# Patient Record
Sex: Female | Born: 1980 | Race: Black or African American | Hispanic: No | Marital: Single | Smoking: Former smoker
Health system: Southern US, Community
[De-identification: ages and names within clinical notes are randomized; demographics above are authoritative.]

## PROBLEM LIST (undated history)

## (undated) DIAGNOSIS — D649 Anemia, unspecified: Secondary | ICD-10-CM

## (undated) DIAGNOSIS — N946 Dysmenorrhea, unspecified: Secondary | ICD-10-CM

## (undated) HISTORY — DX: Anemia, unspecified: D64.9

## (undated) HISTORY — DX: Dysmenorrhea, unspecified: N94.6

---

## 2007-04-12 ENCOUNTER — Emergency Department (HOSPITAL_COMMUNITY): Admission: EM | Admit: 2007-04-12 | Discharge: 2007-04-13 | Payer: Self-pay | Admitting: Emergency Medicine

## 2007-06-12 ENCOUNTER — Encounter: Admission: RE | Admit: 2007-06-12 | Discharge: 2007-06-12 | Payer: Self-pay | Admitting: Family Medicine

## 2007-10-02 ENCOUNTER — Ambulatory Visit: Payer: Self-pay | Admitting: *Deleted

## 2007-10-02 ENCOUNTER — Ambulatory Visit: Payer: Self-pay | Admitting: Internal Medicine

## 2007-10-02 ENCOUNTER — Encounter: Payer: Self-pay | Admitting: Family Medicine

## 2007-10-02 LAB — CONVERTED CEMR LAB
AST: 16 units/L (ref 0–37)
Albumin: 4 g/dL (ref 3.5–5.2)
Alkaline Phosphatase: 87 units/L (ref 39–117)
BUN: 10 mg/dL (ref 6–23)
Calcium: 8.8 mg/dL (ref 8.4–10.5)
Chloride: 108 meq/L (ref 96–112)
Eosinophils Absolute: 0.2 10*3/uL (ref 0.0–0.7)
Glucose, Bld: 88 mg/dL (ref 70–99)
HDL: 56 mg/dL (ref >39)
LDL Cholesterol: 48 mg/dL (ref 0–99)
Lymphs Abs: 1.7 10*3/uL (ref 0.7–4.0)
Monocytes Relative: 8 % (ref 3–12)
Neutro Abs: 3.3 10*3/uL (ref 1.7–7.7)
Neutrophils Relative %: 59 % (ref 43–77)
Platelets: 343 10*3/uL (ref 150–400)
Potassium: 4.3 meq/L (ref 3.5–5.3)
RBC: 3.95 M/uL (ref 3.87–5.11)
Sodium: 141 meq/L (ref 135–145)
TSH: 1.704 microintl units/mL (ref 0.350–5.50)
Total Protein: 7.2 g/dL (ref 6.0–8.3)
Triglycerides: 130 mg/dL (ref <150)
WBC: 5.6 10*3/uL (ref 4.0–10.5)

## 2007-11-13 ENCOUNTER — Encounter: Payer: Self-pay | Admitting: Family Medicine

## 2007-11-13 ENCOUNTER — Ambulatory Visit: Payer: Self-pay | Admitting: Internal Medicine

## 2007-11-13 LAB — CONVERTED CEMR LAB
Lymphocytes Relative: 38 % (ref 12–46)
Lymphs Abs: 1.4 10*3/uL (ref 0.7–4.0)
MCV: 79.2 fL (ref 78.0–100.0)
Monocytes Relative: 12 % (ref 3–12)
Neutrophils Relative %: 46 % (ref 43–77)
Platelets: 165 10*3/uL (ref 150–400)
RBC: 4.09 M/uL (ref 3.87–5.11)
WBC: 3.6 10*3/uL — ABNORMAL LOW (ref 4.0–10.5)

## 2008-02-26 ENCOUNTER — Ambulatory Visit: Payer: Self-pay | Admitting: Internal Medicine

## 2008-07-14 ENCOUNTER — Emergency Department (HOSPITAL_COMMUNITY): Admission: EM | Admit: 2008-07-14 | Discharge: 2008-07-14 | Payer: Self-pay | Admitting: Emergency Medicine

## 2008-10-19 ENCOUNTER — Emergency Department (HOSPITAL_COMMUNITY): Admission: EM | Admit: 2008-10-19 | Discharge: 2008-10-19 | Payer: Self-pay | Admitting: Emergency Medicine

## 2009-06-13 ENCOUNTER — Emergency Department (HOSPITAL_COMMUNITY): Admission: EM | Admit: 2009-06-13 | Discharge: 2009-06-13 | Payer: Self-pay | Admitting: Emergency Medicine

## 2009-06-21 ENCOUNTER — Emergency Department (HOSPITAL_COMMUNITY): Admission: EM | Admit: 2009-06-21 | Discharge: 2009-06-21 | Payer: Self-pay | Admitting: Emergency Medicine

## 2009-10-21 ENCOUNTER — Emergency Department (HOSPITAL_COMMUNITY): Admission: EM | Admit: 2009-10-21 | Discharge: 2009-10-22 | Payer: Self-pay | Admitting: Emergency Medicine

## 2010-05-17 ENCOUNTER — Ambulatory Visit: Admit: 2010-05-17 | Payer: Self-pay | Admitting: Nurse Practitioner

## 2010-07-01 LAB — URINE MICROSCOPIC-ADD ON

## 2010-07-01 LAB — URINALYSIS, ROUTINE W REFLEX MICROSCOPIC
Glucose, UA: NEGATIVE mg/dL
Protein, ur: >300 mg/dL — AB
Specific Gravity, Urine: 1.025 (ref 1.005–1.030)
pH: 5.5 (ref 5.0–8.0)

## 2010-07-09 LAB — URINALYSIS, ROUTINE W REFLEX MICROSCOPIC
Ketones, ur: 40 mg/dL — AB
Specific Gravity, Urine: 1.027 (ref 1.005–1.030)
Urobilinogen, UA: 1 mg/dL (ref 0.0–1.0)

## 2010-07-09 LAB — URINE MICROSCOPIC-ADD ON

## 2010-07-09 LAB — POCT PREGNANCY, URINE: Preg Test, Ur: NEGATIVE

## 2010-07-22 LAB — URINALYSIS, ROUTINE W REFLEX MICROSCOPIC
Glucose, UA: NEGATIVE mg/dL
Protein, ur: NEGATIVE mg/dL
Specific Gravity, Urine: 1.024 (ref 1.005–1.030)
Urobilinogen, UA: 1 mg/dL (ref 0.0–1.0)

## 2010-07-22 LAB — URINE CULTURE: Colony Count: >=100000

## 2010-07-22 LAB — URINE MICROSCOPIC-ADD ON

## 2010-09-28 ENCOUNTER — Emergency Department (HOSPITAL_COMMUNITY)
Admission: EM | Admit: 2010-09-28 | Discharge: 2010-09-28 | Disposition: A | Discharge status: Home / Self Care | Payer: Medicaid Other | Attending: Emergency Medicine | Admitting: Emergency Medicine

## 2010-09-28 ENCOUNTER — Emergency Department (HOSPITAL_COMMUNITY): Payer: Medicaid Other

## 2010-09-28 DIAGNOSIS — S92919A Unspecified fracture of unspecified toe(s), initial encounter for closed fracture: Secondary | ICD-10-CM | POA: Insufficient documentation

## 2010-09-28 DIAGNOSIS — F17200 Nicotine dependence, unspecified, uncomplicated: Secondary | ICD-10-CM | POA: Insufficient documentation

## 2010-09-28 DIAGNOSIS — Y92009 Unspecified place in unspecified non-institutional (private) residence as the place of occurrence of the external cause: Secondary | ICD-10-CM | POA: Insufficient documentation

## 2010-09-28 DIAGNOSIS — M79609 Pain in unspecified limb: Secondary | ICD-10-CM | POA: Insufficient documentation

## 2010-09-28 DIAGNOSIS — IMO0002 Reserved for concepts with insufficient information to code with codable children: Secondary | ICD-10-CM | POA: Insufficient documentation

## 2010-12-11 ENCOUNTER — Emergency Department (HOSPITAL_COMMUNITY)
Admission: EM | Admit: 2010-12-11 | Discharge: 2010-12-12 | Disposition: A | Discharge status: Home / Self Care | Payer: Medicaid Other | Attending: Emergency Medicine | Admitting: Emergency Medicine

## 2010-12-11 DIAGNOSIS — R059 Cough, unspecified: Secondary | ICD-10-CM | POA: Insufficient documentation

## 2010-12-11 DIAGNOSIS — J069 Acute upper respiratory infection, unspecified: Secondary | ICD-10-CM | POA: Insufficient documentation

## 2010-12-11 DIAGNOSIS — R05 Cough: Secondary | ICD-10-CM | POA: Insufficient documentation

## 2010-12-11 DIAGNOSIS — J3489 Other specified disorders of nose and nasal sinuses: Secondary | ICD-10-CM | POA: Insufficient documentation

## 2010-12-17 ENCOUNTER — Emergency Department (HOSPITAL_COMMUNITY)
Admission: EM | Admit: 2010-12-17 | Discharge: 2010-12-17 | Disposition: A | Discharge status: Home / Self Care | Payer: Medicaid Other | Attending: Emergency Medicine | Admitting: Emergency Medicine

## 2010-12-17 DIAGNOSIS — N72 Inflammatory disease of cervix uteri: Secondary | ICD-10-CM | POA: Insufficient documentation

## 2010-12-17 DIAGNOSIS — A5901 Trichomonal vulvovaginitis: Secondary | ICD-10-CM | POA: Insufficient documentation

## 2010-12-17 DIAGNOSIS — N898 Other specified noninflammatory disorders of vagina: Secondary | ICD-10-CM | POA: Insufficient documentation

## 2010-12-17 DIAGNOSIS — R109 Unspecified abdominal pain: Secondary | ICD-10-CM | POA: Insufficient documentation

## 2010-12-17 LAB — WET PREP, GENITAL

## 2010-12-17 LAB — URINALYSIS, ROUTINE W REFLEX MICROSCOPIC
Ketones, ur: NEGATIVE mg/dL
Nitrite: NEGATIVE
Protein, ur: NEGATIVE mg/dL

## 2010-12-17 LAB — POCT PREGNANCY, URINE: Preg Test, Ur: NEGATIVE

## 2010-12-18 LAB — GC/CHLAMYDIA PROBE AMP, GENITAL
Chlamydia, DNA Probe: NEGATIVE
GC Probe Amp, Genital: NEGATIVE

## 2010-12-19 LAB — URINE CULTURE: Colony Count: 40000

## 2011-01-18 LAB — BASIC METABOLIC PANEL
BUN: 6
GFR calc non Af Amer: >60
Glucose, Bld: 95
Potassium: 3.7

## 2011-01-18 LAB — WET PREP, GENITAL
Clue Cells Wet Prep HPF POC: NONE SEEN
Yeast Wet Prep HPF POC: NONE SEEN

## 2011-01-18 LAB — URINALYSIS, ROUTINE W REFLEX MICROSCOPIC
Bilirubin Urine: NEGATIVE
Glucose, UA: NEGATIVE
Hgb urine dipstick: NEGATIVE
Ketones, ur: NEGATIVE
Nitrite: NEGATIVE
Protein, ur: NEGATIVE
Specific Gravity, Urine: 1.027
Urobilinogen, UA: 1
pH: 7.5

## 2011-01-18 LAB — POCT PREGNANCY, URINE
Operator id: 173591
Preg Test, Ur: NEGATIVE

## 2011-01-18 LAB — DIFFERENTIAL
Basophils Absolute: 0.1
Eosinophils Absolute: 0.3
Lymphs Abs: 2.4
Monocytes Absolute: 0.8
Monocytes Relative: 9

## 2011-01-18 LAB — CBC
HCT: 22.7 — ABNORMAL LOW
MCHC: 32.3
MCV: 66.6 — ABNORMAL LOW
Platelets: 330
RDW: 17.9 — ABNORMAL HIGH

## 2011-01-18 LAB — URINE MICROSCOPIC-ADD ON

## 2011-01-18 LAB — RPR: RPR Ser Ql: NONREACTIVE

## 2011-03-13 ENCOUNTER — Emergency Department (HOSPITAL_COMMUNITY)
Admission: EM | Admit: 2011-03-13 | Discharge: 2011-03-14 | Disposition: A | Discharge status: Home / Self Care | Payer: Self-pay | Attending: Emergency Medicine | Admitting: Emergency Medicine

## 2011-03-13 DIAGNOSIS — F411 Generalized anxiety disorder: Secondary | ICD-10-CM | POA: Insufficient documentation

## 2011-03-13 DIAGNOSIS — R1013 Epigastric pain: Secondary | ICD-10-CM | POA: Insufficient documentation

## 2011-03-13 DIAGNOSIS — F17200 Nicotine dependence, unspecified, uncomplicated: Secondary | ICD-10-CM | POA: Insufficient documentation

## 2011-03-13 DIAGNOSIS — R079 Chest pain, unspecified: Secondary | ICD-10-CM | POA: Insufficient documentation

## 2011-03-13 NOTE — ED Notes (Signed)
Sudden onset of chest pressure 30 min prior to [place] - upon arrival - pt visibly upset - anxious - pain is midsternal - tender to touch - relieved with breathing techniques - when gets upset - pain returns.

## 2011-03-14 ENCOUNTER — Emergency Department (HOSPITAL_COMMUNITY): Payer: Self-pay

## 2011-03-14 LAB — COMPREHENSIVE METABOLIC PANEL
ALT: 25 U/L (ref 0–35)
AST: 73 U/L — ABNORMAL HIGH (ref 0–37)
Albumin: 3.3 g/dL — ABNORMAL LOW (ref 3.5–5.2)
CO2: 27 mEq/L (ref 19–32)
Calcium: 9 mg/dL (ref 8.4–10.5)
Chloride: 106 mEq/L (ref 96–112)
GFR calc non Af Amer: >90 mL/min (ref >90)
Sodium: 140 mEq/L (ref 135–145)

## 2011-03-14 LAB — CBC
Hemoglobin: 8.3 g/dL — ABNORMAL LOW (ref 12.0–15.0)
MCHC: 31 g/dL (ref 30.0–36.0)
Platelets: 275 10*3/uL (ref 150–400)
RBC: 3.55 MIL/uL — ABNORMAL LOW (ref 3.87–5.11)

## 2011-03-14 LAB — TROPONIN I: Troponin I: <0.3 ng/mL (ref <0.30)

## 2011-03-14 MED ORDER — GI COCKTAIL ~~LOC~~
30.0000 mL | Freq: Once | ORAL | Status: AC
  Administered 2011-03-14: 30 mL via ORAL
  Filled 2011-03-14: qty 30

## 2011-03-14 MED ORDER — OMEPRAZOLE 20 MG PO CPDR: 20.0000 mg | DELAYED_RELEASE_CAPSULE | Freq: Every day | ORAL | Status: DC

## 2011-03-14 NOTE — ED Provider Notes (Signed)
History     CSN: [id number] Arrival date & time: 03/13/2011 11:46 PM   First MD Initiated Contact with Patient 03/13/11 2347      Chief Complaint  Patient presents with  . Anxiety    (Consider location/radiation/quality/duration/timing/severity/associated sxs/prior treatment) The history is provided by the patient.   patient reports she was sitting on the couch and developed chest tightness in her lower anterior chest.  There is no radiation.  She reports that it was tender when she would touch it and it was worse when she lays flat.  She also reports mild epigastric discomfort at that time.  She is 30 years old her only cardiac risk factors that she smokes cigarettes.  She otherwise is without hypertension hyperlipidemia diabetes or family history of early cardiac disease.  She denies shortness of breath chills or cough.  She denies orthopnea.  She denies lower extreme edema.  No history of PE or DVT.  The symptoms are resolving on the round however she still is with mild discomfort.  She's had no recent travel or surgery.  Nothing worsens her symptoms except for laying flat.  Nothing is improved her symptoms.  She has not tried any medications for his symptoms.  Currently her symptoms are mild.  No past medical history on file.  No past surgical history on file.  No family history on file.  History  Substance Use Topics  . Smoking status: Current Everyday Smoker -- 0.5 packs/day  . Smokeless tobacco: Not on file  . Alcohol Use: No    OB History    Grav Para Term Preterm Abortions TAB SAB Ect Mult Living                  Review of Systems  All other systems reviewed and are negative.    Allergies  Review of patient's allergies indicates no known allergies.  Home Medications   Current Outpatient Rx  Name Route Sig Dispense Refill  . NAPROXEN SODIUM 220 MG PO TABS Oral Take 440 mg by mouth 2 (two) times daily with a meal. For pain       BP 120/68  Pulse 83   Temp(Src) 98 F (36.7 C) (Oral)  Resp 12  SpO2 100%  LMP 03/13/2011  Physical Exam  Nursing note and vitals reviewed. Constitutional: She is oriented to person, place, and time. She appears well-developed and well-nourished. No distress.  HENT:  Head: Normocephalic and atraumatic.  Eyes: EOM are normal.  Neck: Normal range of motion.  Cardiovascular: Normal rate, regular rhythm and normal heart sounds.   Pulmonary/Chest: Effort normal and breath sounds normal.  Abdominal: Soft. She exhibits no distension. There is no tenderness.  Musculoskeletal: Normal range of motion.  Neurological: She is alert and oriented to person, place, and time.  Skin: Skin is warm and dry.  Psychiatric: She has a normal mood and affect. Judgment normal.    ED Course  Procedures (including critical care time)   Date: 03/14/2011  Rate: 75  Rhythm: normal sinus rhythm  QRS Axis: normal  Intervals: normal  ST/T Wave abnormalities: normal  Conduction Disutrbances:none  Narrative Interpretation:   Old EKG Reviewed: No significant changes noted     Labs Reviewed  CBC - Abnormal; Notable for the following:    RBC 3.55 (*)    Hemoglobin 8.3 (*)    HCT 26.8 (*)    MCV 75.5 (*)    MCH 23.4 (*)    RDW 16.8 (*)  All other components within normal limits  COMPREHENSIVE METABOLIC PANEL - Abnormal; Notable for the following:    Glucose, Bld 114 (*)    Albumin 3.3 (*)    AST 73 (*)    Total Bilirubin 0.2 (*)    All other components within normal limits  TROPONIN I  LIPASE, BLOOD   Dg Chest 2 View  03/14/2011  *RADIOLOGY REPORT*  Clinical Data:  Chest pain.  CHEST - 2 VIEW  Comparison: 06/12/2007  Findings: The heart size and mediastinal contours are within normal limits.  Both lungs are clear.  The visualized skeletal structures are unremarkable.  IMPRESSION: No active disease.  Original Report Authenticated By: Reola Calkins, M.D.     1. Chest pain       MDM  Atypical chest pain my  suspicion for cardiac disease is very low.  Pertinent negative.  Doubt DVT and PE.  EKG is normal.  Chest x-ray pending.  We'll obtain basic labs including a lipase given her epigastric pain.  GI cocktail mouth  2:06 AM Complete resolution of symptoms.  Will DC home with a prescription for Prilosec as I suspect this is gastroesophageal reflux disease.       Lyanne Co, MD 03/14/11 [phone]

## 2011-07-18 ENCOUNTER — Encounter (HOSPITAL_COMMUNITY): Payer: Self-pay | Admitting: *Deleted

## 2011-07-18 ENCOUNTER — Emergency Department (HOSPITAL_COMMUNITY)
Admission: EM | Admit: 2011-07-18 | Discharge: 2011-07-18 | Disposition: A | Discharge status: Home / Self Care | Payer: Medicaid Other | Attending: Emergency Medicine | Admitting: Emergency Medicine

## 2011-07-18 DIAGNOSIS — A499 Bacterial infection, unspecified: Secondary | ICD-10-CM | POA: Insufficient documentation

## 2011-07-18 DIAGNOSIS — R0609 Other forms of dyspnea: Secondary | ICD-10-CM | POA: Insufficient documentation

## 2011-07-18 DIAGNOSIS — R0989 Other specified symptoms and signs involving the circulatory and respiratory systems: Secondary | ICD-10-CM | POA: Insufficient documentation

## 2011-07-18 DIAGNOSIS — R109 Unspecified abdominal pain: Secondary | ICD-10-CM | POA: Insufficient documentation

## 2011-07-18 DIAGNOSIS — R110 Nausea: Secondary | ICD-10-CM | POA: Insufficient documentation

## 2011-07-18 DIAGNOSIS — B9689 Other specified bacterial agents as the cause of diseases classified elsewhere: Secondary | ICD-10-CM | POA: Insufficient documentation

## 2011-07-18 DIAGNOSIS — N760 Acute vaginitis: Secondary | ICD-10-CM | POA: Insufficient documentation

## 2011-07-18 LAB — URINALYSIS, ROUTINE W REFLEX MICROSCOPIC
Bilirubin Urine: NEGATIVE
Glucose, UA: NEGATIVE mg/dL
Ketones, ur: NEGATIVE mg/dL
Nitrite: NEGATIVE
Protein, ur: NEGATIVE mg/dL
Specific Gravity, Urine: 1.021 (ref 1.005–1.030)
Urobilinogen, UA: 1 mg/dL (ref 0.0–1.0)
pH: 7 (ref 5.0–8.0)

## 2011-07-18 LAB — URINE MICROSCOPIC-ADD ON

## 2011-07-18 LAB — WET PREP, GENITAL
Trich, Wet Prep: NONE SEEN
Yeast Wet Prep HPF POC: NONE SEEN

## 2011-07-18 MED ORDER — AZITHROMYCIN 250 MG PO TABS
1000.0000 mg | ORAL_TABLET | Freq: Once | ORAL | Status: AC
  Administered 2011-07-18: 1000 mg via ORAL
  Filled 2011-07-18: qty 4

## 2011-07-18 MED ORDER — ONDANSETRON 4 MG PO TBDP
4.0000 mg | ORAL_TABLET | Freq: Once | ORAL | Status: AC
  Administered 2011-07-18: 4 mg via ORAL
  Filled 2011-07-18: qty 1

## 2011-07-18 MED ORDER — CEFTRIAXONE SODIUM 250 MG IJ SOLR
250.0000 mg | Freq: Once | INTRAMUSCULAR | Status: AC
  Administered 2011-07-18: 250 mg via INTRAMUSCULAR
  Filled 2011-07-18: qty 250

## 2011-07-18 MED ORDER — METRONIDAZOLE 500 MG PO TABS: 500.0000 mg | ORAL_TABLET | Freq: Two times a day (BID) | ORAL | Status: AC

## 2011-07-18 MED ORDER — LIDOCAINE HCL (PF) 1 % IJ SOLN
INTRAMUSCULAR | Status: AC
  Filled 2011-07-18: qty 5

## 2011-07-18 MED ORDER — METRONIDAZOLE 500 MG PO TABS
500.0000 mg | ORAL_TABLET | Freq: Once | ORAL | Status: AC
  Administered 2011-07-18: 500 mg via ORAL
  Filled 2011-07-18: qty 1

## 2011-07-18 NOTE — ED Notes (Signed)
The pt is c/o abd pain with a vaginal discharge.

## 2011-07-18 NOTE — ED Notes (Signed)
Pt started feeling bad 2days ago . Pt reports nausea  And ABD pain No vomiting

## 2011-07-18 NOTE — ED Notes (Signed)
meds given rocephin given im.  Needs to wait for 15 minutes before she can leave  In the event of a reaction.  Pt aware

## 2011-07-18 NOTE — Discharge Instructions (Signed)
Bacterial Vaginosis Bacterial vaginosis (BV) is a vaginal infection where the normal balance of bacteria in the vagina is disrupted. The normal balance is then replaced by an overgrowth of certain bacteria. There are several different kinds of bacteria that can cause BV. BV is the most common vaginal infection in women of childbearing age. CAUSES   The cause of BV is not fully understood. BV develops when there is an increase or imbalance of harmful bacteria.   Some activities or behaviors can upset the normal balance of bacteria in the vagina and put women at increased risk including:   Having a new sex partner or multiple sex partners.   Douching.   Using an intrauterine device (IUD) for contraception.   It is not clear what role sexual activity plays in the development of BV. However, women that have never had sexual intercourse are rarely infected with BV.  Women do not get BV from toilet seats, bedding, swimming pools or from touching objects around them.  SYMPTOMS   Grey vaginal discharge.   A fish-like odor with discharge, especially after sexual intercourse.   Itching or burning of the vagina and vulva.   Burning or pain with urination.   Some women have no signs or symptoms at all.  DIAGNOSIS  Your caregiver must examine the vagina for signs of BV. Your caregiver will perform lab tests and look at the sample of vaginal fluid through a microscope. They will look for bacteria and abnormal cells (clue cells), a pH test higher than 4.5, and a positive amine test all associated with BV.  RISKS AND COMPLICATIONS   Pelvic inflammatory disease (PID).   Infections following gynecology surgery.   Developing HIV.   Developing herpes virus.  TREATMENT  Sometimes BV will clear up without treatment. However, all women with symptoms of BV should be treated to avoid complications, especially if gynecology surgery is planned. Female partners generally do not need to be treated. However,  BV may spread between female sex partners so treatment is helpful in preventing a recurrence of BV.   BV may be treated with antibiotics. The antibiotics come in either pill or vaginal cream forms. Either can be used with nonpregnant or pregnant women, but the recommended dosages differ. These antibiotics are not harmful to the baby.   BV can recur after treatment. If this happens, a second round of antibiotics will often be prescribed.   Treatment is important for pregnant women. If not treated, BV can cause a premature delivery, especially for a pregnant woman who had a premature birth in the past. All pregnant women who have symptoms of BV should be checked and treated.   For chronic reoccurrence of BV, treatment with a type of prescribed gel vaginally twice a week is helpful.  HOME CARE INSTRUCTIONS   Finish all medication as directed by your caregiver.   Do not have sex until treatment is completed.   Tell your sexual partner that you have a vaginal infection. They should see their caregiver and be treated if they have problems, such as a mild rash or itching.   Practice safe sex. Use condoms. Only have 1 sex partner.  PREVENTION  Basic prevention steps can help reduce the risk of upsetting the natural balance of bacteria in the vagina and developing BV:  Do not have sexual intercourse (be abstinent).   Do not douche.   Use all of the medicine prescribed for treatment of BV, even if the signs and symptoms go away.     Tell your sex partner if you have BV. That way, they can be treated, if needed, to prevent reoccurrence.  SEEK MEDICAL CARE IF:   Your symptoms are not improving after 3 days of treatment.   You have increased discharge, pain, or fever.  MAKE SURE YOU:   Understand these instructions.   Will watch your condition.   Will get help right away if you are not doing well or get worse.  FOR MORE INFORMATION  Division of STD Prevention (DSTDP), Centers for Disease  Control and Prevention: [employer] American Social Health Association (ASHA): www.ashastd.org  Document Released: 04/01/2005 Document Revised: 03/21/2011 Document Reviewed: 09/22/2008 [facility] Patient Information 2012 [place], [place].Cervicitis Cervicitis is a soreness and puffiness (inflammation) of the cervix. The cervix is at the bottom of the uterus. Your doctor will do an exam to find the cause. Your treatment will depend on the cause. If the cause is a sexual infection, you and your partner both need treatment. HOME CARE  Do not have sex (intercourse) until your doctor says it is okay.   Do not have sex until your partner is treated if your doctor told you not to.   Take medicines (antibiotics) as told. Finish them even if you start to feel better.  GET HELP RIGHT AWAY IF:   Your problems come back.   You have a fever.   You have problems that may be caused by your medicine.  MAKE SURE YOU:   Understand these instructions.   Will watch your condition.   Will get help right away if you are not doing well or get worse.  Document Released: 01/09/2008 Document Revised: 03/21/2011 Document Reviewed: 10/29/2010 [facility] Patient Information 2012 [place], [place]. Cultures have been taken, if anything is positive he will be notified

## 2011-07-18 NOTE — ED Provider Notes (Signed)
History     CSN: [id number]  Arrival date & time 07/18/11  1925   First MD Initiated Contact with Patient 07/18/11 2048      Chief Complaint  Patient presents with  . Abdominal Pain    Nausea    (Consider location/radiation/quality/duration/timing/severity/associated sxs/prior treatment) HPI Comments: Patient reports, that she has suprapubic and low abdominal pain, nausea, and vaginal discharge for the past 3 days.  She has a history of STD in the past has the same sexual partner, who she thinks was treated after last episode, but is unsure  Patient is a 31 y.o. female presenting with abdominal pain. The history is provided by the patient.  Abdominal Pain The primary symptoms of the illness include abdominal pain, nausea and vaginal discharge. The primary symptoms of the illness do not include fever, vomiting, diarrhea or dysuria.  The vaginal discharge is not associated with dysuria.    History reviewed. No pertinent past medical history.  Past Surgical History  Procedure Date  . Cesarean section     times 2    No family history on file.  History  Substance Use Topics  . Smoking status: Current Everyday Smoker -- 0.5 packs/day  . Smokeless tobacco: Not on file  . Alcohol Use: No    OB History    Grav Para Term Preterm Abortions TAB SAB Ect Mult Living                  Review of Systems  Constitutional: Negative for fever.  Gastrointestinal: Positive for nausea and abdominal pain. Negative for vomiting and diarrhea.  Genitourinary: Positive for vaginal discharge. Negative for dysuria and flank pain.    Allergies  Review of patient's allergies indicates no known allergies.  Home Medications   Current Outpatient Rx  Name Route Sig Dispense Refill  . METRONIDAZOLE 500 MG PO TABS Oral Take 1 tablet (500 mg total) by mouth 2 (two) times daily. 14 tablet 0    BP 129/75  Pulse 97  Temp(Src) 99.9 F (37.7 C) (Oral)  Resp 20  SpO2 100%  LMP  06/17/2011  Physical Exam  Constitutional: She appears well-developed and well-nourished.  HENT:  Head: Normocephalic.  Neck: Normal range of motion.  Cardiovascular: Normal rate.   Pulmonary/Chest: She is in respiratory distress.  Abdominal: Soft. Bowel sounds are normal. She exhibits no distension.  Genitourinary: Uterus normal. Right adnexum displays tenderness. Left adnexum displays tenderness. Vaginal discharge found.    ED Course  Procedures (including critical care time)  Labs Reviewed  URINALYSIS, ROUTINE W REFLEX MICROSCOPIC - Abnormal; Notable for the following:    Hgb urine dipstick TRACE (*)    Leukocytes, UA SMALL (*)    All other components within normal limits  WET PREP, GENITAL - Abnormal; Notable for the following:    Clue Cells Wet Prep HPF POC TOO NUMEROUS TO COUNT (*)    WBC, Wet Prep HPF POC MODERATE (*)    All other components within normal limits  POCT PREGNANCY, URINE  URINE MICROSCOPIC-ADD ON  GC/CHLAMYDIA PROBE AMP, GENITAL   No results found.   1. Bacterial vaginosis       MDM  Vaginal discharge, the patient is concerned for an STD.  She has a history of having tremendous in the past        Arman Filter, NP 07/18/11 2235  Arman Filter, NP 07/18/11 2236

## 2011-07-19 LAB — GC/CHLAMYDIA PROBE AMP, GENITAL
Chlamydia, DNA Probe: NEGATIVE
GC Probe Amp, Genital: NEGATIVE

## 2011-07-19 NOTE — ED Provider Notes (Signed)
Medical screening examination/treatment/procedure(s) were performed by non-physician practitioner and as supervising physician I was immediately available for consultation/collaboration.   Maryori Weide, MD 07/19/11 0035 

## 2011-12-06 ENCOUNTER — Emergency Department (HOSPITAL_COMMUNITY)
Admission: EM | Admit: 2011-12-06 | Discharge: 2011-12-06 | Discharge status: Against Medical Advice | Payer: Medicaid Other | Attending: Emergency Medicine | Admitting: Emergency Medicine

## 2011-12-06 ENCOUNTER — Encounter (HOSPITAL_COMMUNITY): Payer: Self-pay | Admitting: Emergency Medicine

## 2011-12-06 DIAGNOSIS — R109 Unspecified abdominal pain: Secondary | ICD-10-CM | POA: Insufficient documentation

## 2011-12-06 LAB — COMPREHENSIVE METABOLIC PANEL
ALT: 6 U/L (ref 0–35)
AST: 11 U/L (ref 0–37)
Alkaline Phosphatase: 80 U/L (ref 39–117)
CO2: 27 mEq/L (ref 19–32)
Calcium: 8.9 mg/dL (ref 8.4–10.5)
Chloride: 105 mEq/L (ref 96–112)
GFR calc Af Amer: >90 mL/min (ref >90)
GFR calc non Af Amer: >90 mL/min (ref >90)
Glucose, Bld: 83 mg/dL (ref 70–99)
Potassium: 3.7 mEq/L (ref 3.5–5.1)
Sodium: 138 mEq/L (ref 135–145)

## 2011-12-06 LAB — CBC WITH DIFFERENTIAL/PLATELET
Basophils Absolute: 0 10*3/uL (ref 0.0–0.1)
Basophils Relative: 0 % (ref 0–1)
Eosinophils Absolute: 0.3 10*3/uL (ref 0.0–0.7)
Hemoglobin: 8.5 g/dL — ABNORMAL LOW (ref 12.0–15.0)
MCH: 22.7 pg — ABNORMAL LOW (ref 26.0–34.0)
MCHC: 30.5 g/dL (ref 30.0–36.0)
Monocytes Absolute: 0.7 10*3/uL (ref 0.1–1.0)
Monocytes Relative: 11 % (ref 3–12)
Neutro Abs: 3.9 10*3/uL (ref 1.7–7.7)
Neutrophils Relative %: 61 % (ref 43–77)
RDW: 16 % — ABNORMAL HIGH (ref 11.5–15.5)

## 2011-12-06 NOTE — ED Notes (Signed)
Patient reports lower abdominal pain started two days ago at home.  Pt. Took advil but the pain did not go away.  Pt. Reports no nausea or vomiting.

## 2011-12-07 ENCOUNTER — Encounter (HOSPITAL_COMMUNITY): Payer: Self-pay | Admitting: Emergency Medicine

## 2011-12-07 ENCOUNTER — Emergency Department (HOSPITAL_COMMUNITY)
Admission: EM | Admit: 2011-12-07 | Discharge: 2011-12-07 | Disposition: A | Discharge status: Home / Self Care | Payer: Medicaid Other | Attending: Emergency Medicine | Admitting: Emergency Medicine

## 2011-12-07 DIAGNOSIS — R109 Unspecified abdominal pain: Secondary | ICD-10-CM | POA: Insufficient documentation

## 2011-12-07 DIAGNOSIS — F17200 Nicotine dependence, unspecified, uncomplicated: Secondary | ICD-10-CM | POA: Insufficient documentation

## 2011-12-07 LAB — CBC WITH DIFFERENTIAL/PLATELET
Basophils Absolute: 0 10*3/uL (ref 0.0–0.1)
Basophils Relative: 0 % (ref 0–1)
Hemoglobin: 8 g/dL — ABNORMAL LOW (ref 12.0–15.0)
MCHC: 31 g/dL (ref 30.0–36.0)
Monocytes Relative: 9 % (ref 3–12)
Neutro Abs: 4.4 10*3/uL (ref 1.7–7.7)
Neutrophils Relative %: 60 % (ref 43–77)
RBC: 3.45 MIL/uL — ABNORMAL LOW (ref 3.87–5.11)

## 2011-12-07 LAB — URINALYSIS, ROUTINE W REFLEX MICROSCOPIC
Hgb urine dipstick: NEGATIVE
Nitrite: NEGATIVE
Specific Gravity, Urine: 1.025 (ref 1.005–1.030)
Urobilinogen, UA: 0.2 mg/dL (ref 0.0–1.0)

## 2011-12-07 LAB — PREGNANCY, URINE: Preg Test, Ur: NEGATIVE

## 2011-12-07 NOTE — ED Notes (Signed)
Pt arrives to ED c/o bilat lower abd pain 8/10 onset 2 days ago.  Pt has no other complaints at this time.  Pt denies blood in stool or urine, nausea or vomiting.  Last BM yesterday-normal in color and consistency. Pt denies recent illness injury.

## 2011-12-07 NOTE — ED Provider Notes (Signed)
History     CSN: [id number]  Arrival date & time 12/07/11  0819   First MD Initiated Contact with Patient 12/07/11 [phone]      Chief Complaint  Patient presents with  . Abdominal Pain    (Consider location/radiation/quality/duration/timing/severity/associated sxs/prior treatment) Patient is a 31 y.o. female presenting with abdominal pain. The history is provided by the patient.  Abdominal Pain The primary symptoms of the illness include abdominal pain and vaginal discharge. The primary symptoms of the illness do not include fever, nausea, vomiting, diarrhea, dysuria or vaginal bleeding.  The vaginal discharge is not associated with dysuria.  Symptoms associated with the illness do not include chills, hematuria or back pain.   she complains of intermittent, abdominal pain for the last days.  She denies nausea, vomiting, fevers, chills, urinary tract symptoms, or diarrhea.  She has had a vaginal discharge is unusual for her.  She denies an other or itching with the discharge.  Her last period was at of this month and it was normal.  She has not had in her course for several weeks.  She has not had a sexual transmitted disease in the past.  She denies change in the pain .  If she eats food or has a bowel movement  History reviewed. No pertinent past medical history.  Past Surgical History  Procedure Date  . Cesarean section     times 2    Family History  Problem Relation Age of Onset  . Diabetes Mother   . Hypertension Mother     History  Substance Use Topics  . Smoking status: Current Everyday Smoker -- 0.5 packs/day  . Smokeless tobacco: Not on file  . Alcohol Use: No    OB History    Grav Para Term Preterm Abortions TAB SAB Ect Mult Living                  Review of Systems  Constitutional: Negative for fever and chills.  Cardiovascular: Negative for chest pain.  Gastrointestinal: Positive for abdominal pain. Negative for nausea, vomiting and diarrhea.    Genitourinary: Positive for vaginal discharge. Negative for dysuria, hematuria and vaginal bleeding.  Musculoskeletal: Negative for back pain.  Skin: Negative for rash.  Neurological: Negative for headaches.  Psychiatric/Behavioral: Negative for confusion.  All other systems reviewed and are negative.    Allergies  Review of patient's allergies indicates no known allergies.  Home Medications  No current outpatient prescriptions on file.  LMP 11/25/2011  Physical Exam  Nursing note and vitals reviewed. Constitutional: She appears well-developed and well-nourished.  HENT:  Head: Normocephalic and atraumatic.  Eyes: Conjunctivae are normal.  Neck: Normal range of motion. Neck supple.  Cardiovascular: Normal rate and regular rhythm.   Pulmonary/Chest: Effort normal and breath sounds normal.  Abdominal: Soft. Bowel sounds are normal. She exhibits no distension. There is tenderness.  Genitourinary: Vaginal discharge found.       Pelvic exam performed with nurse tech chaperone. External vagina, with no lesions. Small amount, white vaginal discharge with mild right adnexal tenderness.  No adnexal masses.  No cervical motion tenderness.  Os closed to digital examination and visual inspection    ED Course  Procedures (including critical care time) 31 year old, female, presents with lower abdominal pain for the past 2 days.  The pain is intermittent, and not associated with nausea, vomiting, diarrhea, or urinary tract symptoms.  She has had a small amount of vaginal discharge, therefore, we'll hold form.  Laboratory testing, and  a pelvic examination for evaluation.  Presently, she does not want any analgesics   Labs Reviewed  CBC WITH DIFFERENTIAL  URINALYSIS, ROUTINE W REFLEX MICROSCOPIC  PREGNANCY, URINE  GC/CHLAMYDIA PROBE AMP, GENITAL  WET PREP, GENITAL   No results found.   No diagnosis found.  10:34 AM Still does not want pain meds.  Explained test results.  She  understands and agrees with plan for discharge.  MDM  Abdominal pain intermittent, lasting only a few minutes at a time with no evidence of acute abdomen or toxicity.  No evidence of urinary tract infection, pregnancy, and no evidence of a sexually transmitted disease.        Cheri Guppy, MD 12/07/11 [phone]

## 2011-12-07 NOTE — ED Notes (Signed)
Pt arrives to ED c/o bilat abd pain onset 2 days ago, 8/10.  Pt has no other complaints at this time.  Last BM yesterday-pt sts normal color and consistency.  Pt denies N/V, blood in stool and urine, diarrea. Pt caox3, NAD, + BS in all 4 quads.

## 2011-12-09 LAB — GC/CHLAMYDIA PROBE AMP, GENITAL: GC Probe Amp, Genital: NEGATIVE

## 2011-12-27 ENCOUNTER — Encounter (HOSPITAL_COMMUNITY): Payer: Self-pay | Admitting: Physical Medicine and Rehabilitation

## 2011-12-27 ENCOUNTER — Emergency Department (HOSPITAL_COMMUNITY)
Admission: EM | Admit: 2011-12-27 | Discharge: 2011-12-27 | Disposition: A | Discharge status: Home / Self Care | Payer: Medicaid Other | Attending: Emergency Medicine | Admitting: Emergency Medicine

## 2011-12-27 DIAGNOSIS — M7661 Achilles tendinitis, right leg: Secondary | ICD-10-CM

## 2011-12-27 DIAGNOSIS — M65979 Unspecified synovitis and tenosynovitis, unspecified ankle and foot: Secondary | ICD-10-CM | POA: Insufficient documentation

## 2011-12-27 DIAGNOSIS — F17200 Nicotine dependence, unspecified, uncomplicated: Secondary | ICD-10-CM | POA: Insufficient documentation

## 2011-12-27 DIAGNOSIS — M659 Synovitis and tenosynovitis, unspecified: Secondary | ICD-10-CM | POA: Insufficient documentation

## 2011-12-27 MED ORDER — IBUPROFEN 600 MG PO TABS: 600.0000 mg | ORAL_TABLET | Freq: Four times a day (QID) | ORAL | Status: AC | PRN

## 2011-12-27 MED ORDER — IBUPROFEN 400 MG PO TABS
600.0000 mg | ORAL_TABLET | Freq: Once | ORAL | Status: AC
  Administered 2011-12-27: 600 mg via ORAL
  Filled 2011-12-27: qty 1

## 2011-12-27 NOTE — ED Notes (Signed)
Pt presents to department for evaluation of R ankle pain. Ongoing x4 days. No swelling noted. Denies recent injury. 5/10 pain that increases with weight bearing. Able to wiggle digits. Pedal pulses present. Sensation intact. Ambulatory to triage.

## 2011-12-27 NOTE — Progress Notes (Signed)
Orthopedic Tech Progress Note Patient Details:  Megan Parks 01-Mar-1981 [id number]  Ortho Devices Type of Ortho Device: ASO Ortho Device/Splint Location: (R) LE Ortho Device/Splint Interventions: Application   Jennye Moccasin 12/27/2011, 6:43 PM

## 2011-12-27 NOTE — ED Provider Notes (Signed)
History   This chart was scribed for Megan Racer, MD, by Frederik Pear. The patient was seen in room TR09C/TR09C and the patient's care was started at 1736.    CSN: [id number]  Arrival date & time 12/27/11  1527   First MD Initiated Contact with Patient 12/27/11 1736      Chief Complaint  Patient presents with  . Ankle Pain   (Consider location/radiation/quality/duration/timing/severity/associated sxs/prior treatment) HPI Megan Parks is a 31 y.o. female who presents to the Emergency Department complaining of consistent, moderate pain of the right ankle that began 4 days ago when she got out of bed. Pt states that pain is aggravated by standing up or applying pressure. Pt denies hearing any popping sounds when the pain began. Pt denies any recent falls or injuries. Pt has been taking Advil to help relieve the pain. Pt denies having taken any antibiotics lately. Pt is able to ambulate but with pain.   No past medical history on file.  Past Surgical History  Procedure Date  . Cesarean section     times 2    Family History  Problem Relation Age of Onset  . Diabetes Mother   . Hypertension Mother     History  Substance Use Topics  . Smoking status: Current Every Day Smoker -- 0.5 packs/day    Types: Cigarettes  . Smokeless tobacco: Not on file  . Alcohol Use: No    OB History    Grav Para Term Preterm Abortions TAB SAB Ect Mult Living                  Review of Systems A complete 10 system review of systems was obtained and all systems are negative except as noted in the HPI and PMH.    Allergies  Review of patient's allergies indicates no known allergies.  Home Medications   Current Outpatient Rx  Name Route Sig Dispense Refill  . IBUPROFEN 600 MG PO TABS Oral Take 1 tablet (600 mg total) by mouth every 6 (six) hours as needed for pain. 30 tablet 0    BP 135/80  Pulse 90  Temp 98.4 F (36.9 C) (Oral)  Resp 16  SpO2 100%  LMP 11/25/2011  Physical  Exam  Nursing note and vitals reviewed. Constitutional: She is oriented to person, place, and time. She appears well-developed and well-nourished. No distress.  HENT:  Head: Normocephalic and atraumatic.  Eyes: EOM are normal.  Neck: Neck supple. No tracheal deviation present.  Cardiovascular: Normal rate.   Pulmonary/Chest: Effort normal. No respiratory distress.  Musculoskeletal: Normal range of motion. She exhibits tenderness.       Tenderness in the right Achilles tendon. No swelling or redness. No obvious deformities. No trauma. Pt has full ROM.  Neurological: She is alert and oriented to person, place, and time.       Neurovascularly intact.   Skin: Skin is warm and dry.  Psychiatric: She has a normal mood and affect. Her behavior is normal.    ED Course  Procedures (including critical care time)  DIAGNOSTIC STUDIES: Oxygen Saturation is 100% on normal, normal by my interpretation.     COORDINATION OF CARE:  17:58: Discussed planned course of treatment with the patient including rest, elevation, ice and Ibuprofen, who is agreeable at this time.  18:30-Medication Orders: Ibuprofen (Advil, Motrin) tablet 600 mg-once  Labs Reviewed - No data to display No results found.   1. Tendonitis, Achilles, right       MDM  I personally performed the services described in this documentation, which was scribed in my presence. The recorded information has been reviewed and considered.      Megan Racer, MD 12/27/11 [phone]

## 2012-08-14 ENCOUNTER — Encounter (HOSPITAL_COMMUNITY): Payer: Self-pay | Admitting: Emergency Medicine

## 2012-08-14 ENCOUNTER — Emergency Department (HOSPITAL_COMMUNITY)
Admission: EM | Admit: 2012-08-14 | Discharge: 2012-08-14 | Disposition: A | Discharge status: Home / Self Care | Payer: Self-pay | Attending: Emergency Medicine | Admitting: Emergency Medicine

## 2012-08-14 DIAGNOSIS — B9689 Other specified bacterial agents as the cause of diseases classified elsewhere: Secondary | ICD-10-CM

## 2012-08-14 DIAGNOSIS — N898 Other specified noninflammatory disorders of vagina: Secondary | ICD-10-CM

## 2012-08-14 DIAGNOSIS — F17200 Nicotine dependence, unspecified, uncomplicated: Secondary | ICD-10-CM | POA: Insufficient documentation

## 2012-08-14 DIAGNOSIS — R109 Unspecified abdominal pain: Secondary | ICD-10-CM | POA: Insufficient documentation

## 2012-08-14 DIAGNOSIS — N760 Acute vaginitis: Secondary | ICD-10-CM | POA: Insufficient documentation

## 2012-08-14 DIAGNOSIS — Z3202 Encounter for pregnancy test, result negative: Secondary | ICD-10-CM | POA: Insufficient documentation

## 2012-08-14 LAB — URINALYSIS, MICROSCOPIC ONLY
Glucose, UA: NEGATIVE mg/dL
Specific Gravity, Urine: 1.008 (ref 1.005–1.030)
pH: 6.5 (ref 5.0–8.0)

## 2012-08-14 LAB — WET PREP, GENITAL: Yeast Wet Prep HPF POC: NONE SEEN

## 2012-08-14 LAB — POCT PREGNANCY, URINE: Preg Test, Ur: NEGATIVE

## 2012-08-14 MED ORDER — METRONIDAZOLE 500 MG PO TABS: 500.0000 mg | ORAL_TABLET | Freq: Two times a day (BID) | ORAL | Status: DC

## 2012-08-14 MED ORDER — CEFTRIAXONE SODIUM 250 MG IJ SOLR
250.0000 mg | Freq: Once | INTRAMUSCULAR | Status: AC
  Administered 2012-08-14: 250 mg via INTRAMUSCULAR
  Filled 2012-08-14: qty 250

## 2012-08-14 MED ORDER — LIDOCAINE HCL (PF) 1 % IJ SOLN
INTRAMUSCULAR | Status: AC
  Filled 2012-08-14: qty 5

## 2012-08-14 MED ORDER — AZITHROMYCIN 250 MG PO TABS
1000.0000 mg | ORAL_TABLET | Freq: Once | ORAL | Status: AC
  Administered 2012-08-14: 1000 mg via ORAL
  Filled 2012-08-14: qty 4

## 2012-08-14 NOTE — ED Provider Notes (Addendum)
History     CSN: [id number]  Arrival date & time 08/14/12  1746   First MD Initiated Contact with Patient 08/14/12 1826      Chief Complaint  Patient presents with  . Abdominal Pain  . Vaginal Discharge    (Consider location/radiation/quality/duration/timing/severity/associated sxs/prior treatment) Patient is a 32 y.o. female presenting with abdominal pain and vaginal discharge. The history is provided by the patient.  Abdominal Pain Pain location:  Suprapubic Pain quality: aching   Pain radiates to:  Does not radiate Pain severity:  Mild Onset quality:  Gradual Duration:  2 days Timing:  Constant Progression:  Unchanged Chronicity:  New Context: recent sexual activity   Relieved by:  Nothing Worsened by:  Nothing tried Associated symptoms: vaginal discharge   Associated symptoms: no dysuria and no nausea   Vaginal discharge:    Quality:  White and milky   Severity:  Moderate   Onset quality:  Gradual   Duration:  3 days   Timing:  Constant   Progression:  Worsening   Chronicity:  New Vaginal Discharge Associated symptoms include abdominal pain.    History reviewed. No pertinent past medical history.  Past Surgical History  Procedure Laterality Date  . Cesarean section      times 2    Family History  Problem Relation Age of Onset  . Diabetes Mother   . Hypertension Mother     History  Substance Use Topics  . Smoking status: Current Every Day Smoker -- 0.50 packs/day    Types: Cigarettes  . Smokeless tobacco: Not on file  . Alcohol Use: No    OB History   Grav Para Term Preterm Abortions TAB SAB Ect Mult Living                  Review of Systems  Gastrointestinal: Positive for abdominal pain. Negative for nausea.  Genitourinary: Positive for vaginal discharge. Negative for dysuria.  All other systems reviewed and are negative.    Allergies  Review of patient's allergies indicates no known allergies.  Home Medications  No current  outpatient prescriptions on file.  BP 156/94  Pulse 94  Temp(Src) 99 F (37.2 C) (Oral)  Resp 18  SpO2 99%  Physical Exam  Nursing note and vitals reviewed. Constitutional: She is oriented to person, place, and time. She appears well-developed and well-nourished. No distress.  HENT:  Head: Normocephalic and atraumatic.  Mouth/Throat: Oropharynx is clear and moist.  Eyes: Conjunctivae and EOM are normal. Pupils are equal, round, and reactive to light.  Neck: Normal range of motion. Neck supple.  Cardiovascular: Normal rate, regular rhythm and intact distal pulses.   No murmur heard. Pulmonary/Chest: Effort normal and breath sounds normal. No respiratory distress. She has no wheezes. She has no rales.  Abdominal: Soft. She exhibits no distension. There is tenderness in the suprapubic area. There is no rebound and no guarding.  Genitourinary: Uterus normal. There is no rash or tenderness on the right labia. There is no rash or tenderness on the left labia. Cervix exhibits discharge. Cervix exhibits no motion tenderness. Right adnexum displays no mass, no tenderness and no fullness. Left adnexum displays no mass, no tenderness and no fullness. No tenderness around the vagina. Vaginal discharge found.  Musculoskeletal: Normal range of motion. She exhibits no edema and no tenderness.  Neurological: She is alert and oriented to person, place, and time.  Skin: Skin is warm and dry. No rash noted. No erythema.  Psychiatric: She has a  normal mood and affect. Her behavior is normal.    ED Course  Procedures (including critical care time)  Labs Reviewed  WET PREP, GENITAL - Abnormal; Notable for the following:    Clue Cells Wet Prep HPF POC MODERATE (*)    WBC, Wet Prep HPF POC MODERATE (*)    All other components within normal limits  URINALYSIS, MICROSCOPIC ONLY - Abnormal; Notable for the following:    Hgb urine dipstick SMALL (*)    Squamous Epithelial / LPF FEW (*)    All other  components within normal limits  GC/CHLAMYDIA PROBE AMP  POCT PREGNANCY, URINE   No results found.   1. Vaginal discharge   2. Bacterial vaginosis       MDM   Patient here complaining of lower abdominal pain and vaginal discharge. Concern for STD patient is sexually active and does not use protection. She denies any urinary symptoms and has no signs of PID on exam. UA is within normal limits and urine pregnancy test is negative. Wet prep and GC chlamydia pending  8:09 PM Wet prep with many white blood cells and positive for clue cells. However no noted the yeast or trichomonas. Patient's symptoms could all be from bacterial vaginosis however will cover for GC and Chlamydia with Rocephin and azithromycin. The findings were discussed with patient and she was discharged home with Flagyl      Megan Sprout, MD 08/14/12 2009  Megan Sprout, MD 08/14/12 2010

## 2012-08-14 NOTE — ED Notes (Signed)
Pt c/o lower abd pain and vaginal discharge x 3 days

## 2012-08-28 ENCOUNTER — Encounter (HOSPITAL_COMMUNITY): Payer: Self-pay | Admitting: *Deleted

## 2012-08-28 ENCOUNTER — Emergency Department (HOSPITAL_COMMUNITY)
Admission: EM | Admit: 2012-08-28 | Discharge: 2012-08-28 | Disposition: A | Discharge status: Home / Self Care | Payer: Self-pay | Attending: Emergency Medicine | Admitting: Emergency Medicine

## 2012-08-28 DIAGNOSIS — R51 Headache: Secondary | ICD-10-CM | POA: Insufficient documentation

## 2012-08-28 DIAGNOSIS — F17200 Nicotine dependence, unspecified, uncomplicated: Secondary | ICD-10-CM | POA: Insufficient documentation

## 2012-08-28 MED ORDER — IBUPROFEN 400 MG PO TABS
400.0000 mg | ORAL_TABLET | Freq: Once | ORAL | Status: AC
  Administered 2012-08-28: 400 mg via ORAL
  Filled 2012-08-28: qty 1

## 2012-08-28 MED ORDER — DIAZEPAM 2 MG PO TABS: 5.0000 mg | ORAL_TABLET | Freq: Four times a day (QID) | ORAL | Status: DC | PRN

## 2012-08-28 MED ORDER — DIAZEPAM 5 MG PO TABS
5.0000 mg | ORAL_TABLET | Freq: Once | ORAL | Status: AC
  Administered 2012-08-28: 5 mg via ORAL
  Filled 2012-08-28: qty 1

## 2012-08-28 MED ORDER — NAPROXEN 500 MG PO TABS: 500.0000 mg | ORAL_TABLET | Freq: Two times a day (BID) | ORAL | Status: DC

## 2012-08-28 NOTE — ED Provider Notes (Signed)
History     CSN: [id number]  Arrival date & time 08/28/12  0456   First MD Initiated Contact with Patient 08/28/12 0518      Chief Complaint  Patient presents with  . Headache    (Consider location/radiation/quality/duration/timing/severity/associated sxs/prior treatment) Patient is a 32 y.o. female presenting with headaches. The history is provided by the patient.  Headache Pain location:  L temporal Quality:  Dull Radiates to:  Does not radiate Severity currently:  7/10 Severity at highest:  8/10 Onset quality:  Gradual Duration:  3 days Timing:  Intermittent Progression:  Waxing and waning Chronicity:  Recurrent Similar to prior headaches: yes   Context: caffeine, loud noise and straining   Context: not activity, not exposure to bright light, not coughing, not defecating, not eating, not exposure to cold air and not intercourse   Relieved by:  Nothing Worsened by:  Sound Ineffective treatments:  NSAIDs Associated symptoms: no abdominal pain, no back pain, no blurred vision, no congestion, no cough, no diarrhea, no dizziness, no drainage, no ear pain, no pain, no facial pain, no fatigue, no fever, no focal weakness, no hearing loss, no loss of balance, no nausea, no neck pain, no neck stiffness, no numbness, no paresthesias, no photophobia, no seizures, no sinus pressure, no sore throat, no swollen glands, no syncope, no tingling, no URI, no visual change, no vomiting and no weakness   Risk factors: no anger, no family hx of SAH, does not have insomnia and lifestyle not sedentary     History reviewed. No pertinent past medical history.  Past Surgical History  Procedure Laterality Date  . Cesarean section      times 2    Family History  Problem Relation Age of Onset  . Diabetes Mother   . Hypertension Mother     History  Substance Use Topics  . Smoking status: Current Every Day Smoker -- 0.50 packs/day    Types: Cigarettes  . Smokeless tobacco: Not on file  .  Alcohol Use: No    OB History   Grav Para Term Preterm Abortions TAB SAB Ect Mult Living   2 2 2              Review of Systems  Constitutional: Negative for fever and fatigue.  HENT: Negative for hearing loss, ear pain, congestion, sore throat, neck pain, neck stiffness, postnasal drip and sinus pressure.   Eyes: Negative for blurred vision, photophobia and pain.  Respiratory: Negative for cough.   Cardiovascular: Negative for syncope.  Gastrointestinal: Negative for nausea, vomiting, abdominal pain and diarrhea.  Musculoskeletal: Negative for back pain.  Neurological: Positive for headaches. Negative for dizziness, focal weakness, seizures, numbness, paresthesias and loss of balance.  All other systems reviewed and are negative.    Allergies  Review of patient's allergies indicates no known allergies.  Home Medications   Current Outpatient Rx  Name  Route  Sig  Dispense  Refill  . ibuprofen (ADVIL,MOTRIN) 200 MG tablet   Oral   Take 400 mg by mouth every 6 (six) hours as needed for pain.           BP 145/98  Pulse 81  Temp(Src) 98.4 F (36.9 C) (Oral)  Resp 20  SpO2 100%  LMP 08/20/2012  Physical Exam  Nursing note and vitals reviewed. Constitutional: She is oriented to person, place, and time. She appears well-developed and well-nourished. No distress.  HENT:  Head: Normocephalic and atraumatic.  Eyes: Conjunctivae and EOM are normal. Pupils are  equal, round, and reactive to light. No scleral icterus.  Neck: Normal range of motion.  Neck supple with no nuchal rigidity, full pain free ROM. No carotid bruit  Cardiovascular: Normal rate, regular rhythm, normal heart sounds and intact distal pulses.   Pulmonary/Chest: Effort normal and breath sounds normal. No respiratory distress.  Musculoskeletal: Normal range of motion.  Neurological: She is alert and oriented to person, place, and time. She has normal strength.  CN III-XII intact, good coordination, normal  gait, strength 5/5 bilaterally, intact distal sensation.  Skin: Skin is warm and dry.  No rash, non diaphoretic    ED Course  Procedures (including critical care time)  Labs Reviewed - No data to display No results found.   No diagnosis found.    MDM  Pt HA treated and improved while in ED.  Presentation is like pts typical HA and non concerning for [facility], ICH, Meningitis, or temporal arteritis. Pt is afebrile with no focal neuro deficits, nuchal rigidity, or change in vision. Pt is to follow up with PCP to discuss prophylactic medication. Pt verbalizes understanding and is agreeable with plan to dc.          Jaci Carrel, [place] 08/28/12 [phone]

## 2012-08-28 NOTE — ED Notes (Signed)
Pt states she has been having a headache for 3 days.  Pt reports taking motrin without relief.  Pt reports having a similar headache a "couple years back" that stopped on its own.

## 2012-08-28 NOTE — ED Provider Notes (Signed)
Medical screening examination/treatment/procedure(s) were performed by non-physician practitioner and as supervising physician I was immediately available for consultation/collaboration.  Olivia Mackie, MD 08/28/12 [phone]

## 2013-02-10 ENCOUNTER — Emergency Department (HOSPITAL_COMMUNITY)
Admission: EM | Admit: 2013-02-10 | Discharge: 2013-02-10 | Disposition: A | Discharge status: Home / Self Care | Payer: Medicaid Other | Attending: Emergency Medicine | Admitting: Emergency Medicine

## 2013-02-10 ENCOUNTER — Encounter (HOSPITAL_COMMUNITY): Payer: Self-pay | Admitting: Emergency Medicine

## 2013-02-10 DIAGNOSIS — Y929 Unspecified place or not applicable: Secondary | ICD-10-CM | POA: Insufficient documentation

## 2013-02-10 DIAGNOSIS — Y939 Activity, unspecified: Secondary | ICD-10-CM | POA: Insufficient documentation

## 2013-02-10 DIAGNOSIS — R209 Unspecified disturbances of skin sensation: Secondary | ICD-10-CM | POA: Insufficient documentation

## 2013-02-10 DIAGNOSIS — S63509A Unspecified sprain of unspecified wrist, initial encounter: Secondary | ICD-10-CM | POA: Insufficient documentation

## 2013-02-10 DIAGNOSIS — Z791 Long term (current) use of non-steroidal anti-inflammatories (NSAID): Secondary | ICD-10-CM | POA: Insufficient documentation

## 2013-02-10 DIAGNOSIS — S66911A Strain of unspecified muscle, fascia and tendon at wrist and hand level, right hand, initial encounter: Secondary | ICD-10-CM

## 2013-02-10 DIAGNOSIS — X58XXXA Exposure to other specified factors, initial encounter: Secondary | ICD-10-CM | POA: Insufficient documentation

## 2013-02-10 DIAGNOSIS — F17200 Nicotine dependence, unspecified, uncomplicated: Secondary | ICD-10-CM | POA: Insufficient documentation

## 2013-02-10 MED ORDER — IBUPROFEN 800 MG PO TABS: 800.0000 mg | ORAL_TABLET | Freq: Three times a day (TID) | ORAL | Status: DC

## 2013-02-10 MED ORDER — IBUPROFEN 400 MG PO TABS
800.0000 mg | ORAL_TABLET | Freq: Once | ORAL | Status: AC
  Administered 2013-02-10: 800 mg via ORAL
  Filled 2013-02-10: qty 2

## 2013-02-10 NOTE — ED Provider Notes (Signed)
CSN: [id number]     Arrival date & time 02/10/13  1536 History  This chart was scribed for non-physician practitioner Dierdre Forth, PA-C working with Audree Camel, MD by Danella Maiers, ED Scribe. This patient was seen in room TR05C/TR05C and the patient's care was started at 4:02 PM.   Chief Complaint  Patient presents with  . Wrist Pain   Patient is a 32 y.o. female presenting with wrist pain. The history is provided by the patient. No language interpreter was used.  Wrist Pain Pertinent negatives include no chest pain, no abdominal pain, no headaches and no shortness of breath.   HPI Comments: Megan Parks is a 32 y.o. female who presents to the Emergency Department complaining of right wrist pain onset last night that worsened this morning.  She states it feels like similar pain to a sprained ankle. She has not noticed any bruising or swelling. Holding it still makes it better. She states the pain worsens with movement. At work she is constantly flipping boxes but denies repetitive movements like keyboard work or small parts.  She reports intermittent tingling in the fingers but denies numbness. She has not taken any OTC medications for the pain.Pt denies injuries or falls. She denies prior problems with this hand.  She has no chronic medical problems. She is not on any medications currently.  History reviewed. No pertinent past medical history. Past Surgical History  Procedure Laterality Date  . Cesarean section      times 2   Family History  Problem Relation Age of Onset  . Diabetes Mother   . Hypertension Mother    History  Substance Use Topics  . Smoking status: Current Every Day Smoker -- 0.50 packs/day    Types: Cigarettes  . Smokeless tobacco: Not on file  . Alcohol Use: No   OB History   Grav Para Term Preterm Abortions TAB SAB Ect Mult Living   2 2 2             Review of Systems  Constitutional: Negative for fever, diaphoresis, appetite change, fatigue  and unexpected weight change.  HENT: Negative for mouth sores.   Eyes: Negative for visual disturbance.  Respiratory: Negative for cough, chest tightness, shortness of breath and wheezing.   Cardiovascular: Negative for chest pain.  Gastrointestinal: Negative for nausea, vomiting, abdominal pain, diarrhea and constipation.  Endocrine: Negative for polydipsia, polyphagia and polyuria.  Genitourinary: Negative for dysuria, urgency, frequency and hematuria.  Musculoskeletal: Positive for arthralgias. Negative for back pain and neck stiffness.  Skin: Negative for rash.  Allergic/Immunologic: Negative for immunocompromised state.  Neurological: Negative for syncope, light-headedness and headaches.  Hematological: Does not bruise/bleed easily.  Psychiatric/Behavioral: Negative for sleep disturbance. The patient is not nervous/anxious.     Allergies  Review of patient's allergies indicates no known allergies.  Home Medications   Current Outpatient Rx  Name  Route  Sig  Dispense  Refill  . ibuprofen (ADVIL,MOTRIN) 800 MG tablet   Oral   Take 1 tablet (800 mg total) by mouth 3 (three) times daily.   21 tablet   0    BP 128/87  Pulse 100  Temp(Src) 98.6 F (37 C) (Oral)  SpO2 98% Physical Exam  Nursing note and vitals reviewed. Constitutional: She appears well-developed and well-nourished. No distress.  HENT:  Head: Normocephalic and atraumatic.  Eyes: Conjunctivae are normal.  Neck: Normal range of motion.  Cardiovascular: Normal rate, regular rhythm, normal heart sounds and intact distal pulses.  Capillary refill < 3 sec  Pulmonary/Chest: Effort normal and breath sounds normal. No respiratory distress.  Musculoskeletal: She exhibits tenderness. She exhibits no edema.       Right wrist: She exhibits decreased range of motion and tenderness. She exhibits no bony tenderness, no swelling, no effusion, no crepitus, no deformity and no laceration.  ROM: mild decreased ROM secondary  to pain. Sensation intact. subjective paresthesias. 5/5 strength, intact resisted flexion and extension of the fingers and thumb. Strong grip strength. tender along the bilateral joint line of the wrist. No tenderness over the scaphoid; no snuff box tenderness.  Neurological: She is alert. Coordination normal.  Sensation intact Strength 5/5  Skin: Skin is warm and dry. She is not diaphoretic.  No tenting of the skin No ecchymosis or swelling of the right wrist  Psychiatric: She has a normal mood and affect.    ED Course  Procedures (including critical care time) Medications  ibuprofen (ADVIL,MOTRIN) tablet 800 mg (800 mg Oral Given 02/10/13 1622)    DIAGNOSTIC STUDIES: Oxygen Saturation is 98% on RA, normal by my interpretation.    COORDINATION OF CARE: 4:15 PM- Discussed treatment plan with pt which includes splint application. Pt agrees to plan.    Labs Review Labs Reviewed - No data to display Imaging Review No results found.  EKG Interpretation   None       MDM   1. Wrist strain, right, initial encounter      Megan Parks presents with nontraumatic wrist injury. Pt works with her hands at work but does not recall an injury.  Phalen's and Tinel's test negative.  Finkelstein's test positive the patient with pain on both the radial and ulnar side with test. Unlikely de Quervain's. Patient's pain is nontraumatic and fracture is unlikely, no imaging indicated at this time. Doubt fracture or dislocation. Pain managed in ED. Pt advised to follow up with orthopedics if symptoms persist for further evaluation and treatment. Patient given wrist splint while in ED, conservative therapy recommended and discussed. Patient will be dc home & is agreeable with above plan.  It has been determined that no acute conditions requiring further emergency intervention are present at this time. The patient/guardian have been advised of the diagnosis and plan. We have discussed signs and  symptoms that warrant return to the ED, such as changes or worsening in symptoms.   Vital signs are stable at discharge.   BP 128/87  Pulse 100  Temp(Src) 98.6 F (37 C) (Oral)  SpO2 98%  Patient/guardian has voiced understanding and agreed to follow-up with the PCP or specialist.    I personally performed the services described in this documentation, which was scribed in my presence. The recorded information has been reviewed and is accurate.    Dahlia Client Maudine Kluesner, PA-C 02/10/13 1626

## 2013-02-10 NOTE — ED Notes (Signed)
Pt c/o right wrist pain x 2 days; pt denies obvious injury; CMS intact 

## 2013-02-11 NOTE — ED Provider Notes (Signed)
Medical screening examination/treatment/procedure(s) were performed by non-physician practitioner and as supervising physician I was immediately available for consultation/collaboration.  EKG Interpretation   None         Audree Camel, MD 02/11/13 [phone]

## 2013-06-01 ENCOUNTER — Encounter: Payer: Self-pay | Admitting: Gynecology

## 2013-06-29 ENCOUNTER — Encounter: Payer: Self-pay | Admitting: Gynecology

## 2013-06-29 ENCOUNTER — Ambulatory Visit (INDEPENDENT_AMBULATORY_CARE_PROVIDER_SITE_OTHER): Payer: BC Managed Care – PPO | Admitting: Gynecology

## 2013-06-29 VITALS — BP 149/80 | HR 95 | Resp 14 | Ht 64.5 in | Wt 225.0 lb

## 2013-06-29 DIAGNOSIS — D649 Anemia, unspecified: Secondary | ICD-10-CM | POA: Insufficient documentation

## 2013-06-29 DIAGNOSIS — Z124 Encounter for screening for malignant neoplasm of cervix: Secondary | ICD-10-CM

## 2013-06-29 DIAGNOSIS — E669 Obesity, unspecified: Secondary | ICD-10-CM

## 2013-06-29 DIAGNOSIS — F17200 Nicotine dependence, unspecified, uncomplicated: Secondary | ICD-10-CM

## 2013-06-29 DIAGNOSIS — N898 Other specified noninflammatory disorders of vagina: Secondary | ICD-10-CM

## 2013-06-29 DIAGNOSIS — N920 Excessive and frequent menstruation with regular cycle: Secondary | ICD-10-CM

## 2013-06-29 DIAGNOSIS — Z0000 Encounter for general adult medical examination without abnormal findings: Secondary | ICD-10-CM

## 2013-06-29 DIAGNOSIS — N921 Excessive and frequent menstruation with irregular cycle: Secondary | ICD-10-CM | POA: Insufficient documentation

## 2013-06-29 DIAGNOSIS — Z01419 Encounter for gynecological examination (general) (routine) without abnormal findings: Secondary | ICD-10-CM

## 2013-06-29 HISTORY — DX: Anemia, unspecified: D64.9

## 2013-06-29 LAB — POCT URINALYSIS DIPSTICK
Leukocytes, UA: NEGATIVE
PH UA: 5
RBC UA: 2
UROBILINOGEN UA: NEGATIVE

## 2013-06-29 LAB — POCT WET PREP (WET MOUNT): Clue Cells Wet Prep Whiff POC: POSITIVE

## 2013-06-29 LAB — IBC PANEL
%SAT: 4 % — AB (ref 20–55)
TIBC: 367 ug/dL (ref 250–470)
UIBC: 354 ug/dL (ref 125–400)

## 2013-06-29 LAB — CBC
HCT: 27.9 % — ABNORMAL LOW (ref 36.0–46.0)
Hemoglobin: 8.8 g/dL — ABNORMAL LOW (ref 12.0–15.0)
MCH: 23.7 pg — AB (ref 26.0–34.0)
MCHC: 31.5 g/dL (ref 30.0–36.0)
MCV: 75 fL — AB (ref 78.0–100.0)
PLATELETS: 409 10*3/uL — AB (ref 150–400)
RBC: 3.72 MIL/uL — AB (ref 3.87–5.11)
RDW: 18.5 % — ABNORMAL HIGH (ref 11.5–15.5)
WBC: 7.5 10*3/uL (ref 4.0–10.5)

## 2013-06-29 LAB — IRON: Iron: 13 ug/dL — ABNORMAL LOW (ref 42–145)

## 2013-06-29 LAB — HEMOGLOBIN, FINGERSTICK: HEMOGLOBIN, FINGERSTICK: 9.1 g/dL — AB (ref 12.0–16.0)

## 2013-06-29 MED ORDER — TINIDAZOLE 500 MG PO TABS: 2.0000 g | ORAL_TABLET | Freq: Once | ORAL | Status: DC

## 2013-06-29 NOTE — Patient Instructions (Signed)

## 2013-06-29 NOTE — Progress Notes (Signed)
33 y.o. Single African American female   [phone] here for annual exam. Pt is  notcurrently sexually active.  Pt reports that her cycles are irregular- 21-35d apart.  Can change a super+ tampon every 7071m for the first 2d.  Sleeps with tampon and pad and will have accidents, sometimes will need to leave work due to soilage.  Bleeding is unchanged.  Pt is not sexually active for 2y.  No fibroids.   Pt reports abnormal PAP but cannot recall, years ago, no colposcopy. Pt reports recurrent vaginitis, was seen in ER 5/14 diagnosed with BV treated with pills, had good results but recurred again.  Pt had been seen 4x/3066m all with BV only.  Negative GC/CTM.  Pt reports odor associated with discharge.   Pt has a 17y tobacco history, has tried to quit cold [place] in past without success.  Pt states that she never tried patches or gum.  Never tried vapors.  Pt works with many smokers but states she is motivated to quit, her children do not smoke.  Patient's last menstrual period was 06/25/2013.          Sexually active: no not currently  The current method of family planning is condoms most of the time.    Exercising: no  Last pap: 4-5 years ago- WNL  Alcohol: no Tobacco: 1 pack q 2 days  BSE: yes  Hgb: 9.1 ; Urine: Blood 2    No health maintenance topics applied.  Family History  Problem Relation Age of Onset  . Diabetes Mother   . Breast cancer Paternal Aunt   . Diabetes Maternal Grandmother   . Hypertension Maternal Grandmother     There are no active problems to display for this patient.   Past Medical History  Diagnosis Date  . Dysmenorrhea     Past Surgical History  Procedure Laterality Date  . Cesarean section      times 2    Allergies: Review of patient's allergies indicates no known allergies.  Current Outpatient Prescriptions  Medication Sig Dispense Refill  . ibuprofen (ADVIL,MOTRIN) 800 MG tablet Take 800 mg by mouth as needed.       No current facility-administered  medications for this visit.    ROS: Pertinent items are noted in HPI.  Exam:    BP 149/80  Pulse 95  Resp 14  Ht 5' 4.5" (1.638 m)  Wt 225 lb (102.059 kg)  BMI 38.04 kg/m2  LMP 06/25/2013 Weight change: @WEIGHTCHANGE @ Last 3 height recordings:  Ht Readings from Last 3 Encounters:  06/29/13 5' 4.5" (1.638 m)  12/06/11 5\' 3"  (1.6 m)   General appearance: alert, cooperative and appears stated age Head: Normocephalic, without obvious abnormality, atraumatic Neck: no adenopathy, no carotid bruit, no JVD, supple, symmetrical, trachea midline and thyroid not enlarged, symmetric, no tenderness/mass/nodules Lungs: clear to auscultation bilaterally Breasts: normal appearance, no masses or tenderness Heart: regular rate and rhythm, S1, S2 normal, no murmur, click, rub or gallop Abdomen: soft, non-tender; bowel sounds normal; no masses,  no organomegaly Extremities: extremities normal, atraumatic, no cyanosis or edema Skin: Skin color, texture, turgor normal. No rashes or lesions Lymph nodes: Cervical, supraclavicular, and axillary nodes normal. no inguinal nodes palpated Neurologic: Grossly normal   Pelvic: External genitalia:  no lesions              Urethra: normal appearing urethra with no masses, tenderness or lesions              Bartholins and Skenes:  Bartholin's, Urethra, Skene's normal                 Vagina: normal appearing vagina with normal color and discharge, no lesions              Cervix: normal appearance              Pap taken: yes        Bimanual Exam:  Uterus:  uterus is normal size, shape, consistency and nontender                                      Adnexa:    normal adnexa in size, nontender and no masses                                      Rectovaginal: Confirms                                      Anus:  normal sphincter tone, no lesions  A: well woman Irregular menses Anemia Menorrhagia Obesity Chronic vaginitis     P: mammogram pap smear with  HRHPV BV on wet prep:  Tindamax, avoid douching, will rto for test of cure and discussion regarding suppression options Cbc and iron studies Discussed smoking sensation techniques, may want to avoid smokers, to try to slowly decrease cig/d counseled on breast self exam, mammography screening, feminine hygiene, adequate intake of calcium and vitamin D, diet and exercise return annually or prn   An After Visit Summary was printed and given to the patient.

## 2013-06-30 LAB — FERRITIN: Ferritin: <1 ng/mL — ABNORMAL LOW (ref 10–291)

## 2013-07-02 LAB — IPS PAP TEST WITH HPV

## 2013-07-14 ENCOUNTER — Ambulatory Visit (INDEPENDENT_AMBULATORY_CARE_PROVIDER_SITE_OTHER): Payer: BC Managed Care – PPO | Admitting: Gynecology

## 2013-07-14 ENCOUNTER — Encounter: Payer: Self-pay | Admitting: Gynecology

## 2013-07-14 VITALS — BP 120/84 | Resp 18 | Ht 64.5 in | Wt 224.0 lb

## 2013-07-14 DIAGNOSIS — N898 Other specified noninflammatory disorders of vagina: Secondary | ICD-10-CM

## 2013-07-14 LAB — POCT WET PREP (WET MOUNT): Clue Cells Wet Prep Whiff POC: NEGATIVE

## 2013-07-14 MED ORDER — NONFORMULARY OR COMPOUNDED ITEM: Status: DC

## 2013-07-14 NOTE — Progress Notes (Signed)
Subjective:     Patient ID: Megan Parks, female   DOB: 04/28/1980, 33 y.o.   MRN: [id number]  HPI Comments: Pt here for test of cure after treated for BV, 5x/4757m.  Pt was interested in test of cure.  She took tindamax and reports god results-no discharge or odor.  No sex.  Pt reports menses started yesterday.    Review of Systems Per HPI    Objective:   Physical Exam  Nursing note and vitals reviewed. Constitutional: She is oriented to person, place, and time. She appears well-developed and well-nourished.  Neurological: She is alert and oriented to person, place, and time.   Pelvic: External genitalia:  no lesions              Urethra:  normal appearing urethra with no masses, tenderness or lesions              Bartholins and Skenes: normal                 Vagina: normal appearing vagina with normal color and discharge, no lesions, mesntrum              Cervix: normal appearance               WP done    Assessment:     Recurrent BV treated    Plan:     Discussed options of routine boric acid metrogel We will try boric acid once a week and after sex and menses rto 6925m

## 2013-07-14 DEATH — deceased

## 2013-08-03 ENCOUNTER — Encounter: Payer: Self-pay | Admitting: Gynecology

## 2013-08-03 ENCOUNTER — Telehealth: Payer: Self-pay | Admitting: Gynecology

## 2013-08-03 ENCOUNTER — Ambulatory Visit: Payer: BC Managed Care – PPO | Admitting: Gynecology

## 2013-08-03 NOTE — Telephone Encounter (Signed)
[facility] . Patient said she forgot. I rescheduled her to 08/06/13 @ 8:00. Patient charged $50.00 and [facility]  [facility] mailed Jerilee Field/nr

## 2013-08-06 ENCOUNTER — Encounter: Payer: Self-pay | Admitting: Gynecology

## 2013-08-06 ENCOUNTER — Ambulatory Visit: Payer: BC Managed Care – PPO | Admitting: Gynecology

## 2013-09-13 ENCOUNTER — Encounter: Payer: Self-pay | Admitting: Gynecology

## 2013-10-13 ENCOUNTER — Encounter: Payer: Self-pay | Admitting: Gynecology

## 2013-10-13 ENCOUNTER — Ambulatory Visit: Payer: BC Managed Care – PPO | Admitting: Gynecology

## 2013-10-14 DIAGNOSIS — Z862 Personal history of diseases of the blood and blood-forming organs and certain disorders involving the immune mechanism: Secondary | ICD-10-CM | POA: Insufficient documentation

## 2013-10-14 DIAGNOSIS — IMO0002 Reserved for concepts with insufficient information to code with codable children: Secondary | ICD-10-CM | POA: Insufficient documentation

## 2013-10-14 DIAGNOSIS — Z8742 Personal history of other diseases of the female genital tract: Secondary | ICD-10-CM | POA: Insufficient documentation

## 2013-10-14 DIAGNOSIS — Y929 Unspecified place or not applicable: Secondary | ICD-10-CM | POA: Insufficient documentation

## 2013-10-14 DIAGNOSIS — Y939 Activity, unspecified: Secondary | ICD-10-CM | POA: Insufficient documentation

## 2013-10-14 DIAGNOSIS — M542 Cervicalgia: Secondary | ICD-10-CM | POA: Insufficient documentation

## 2013-10-14 DIAGNOSIS — F17200 Nicotine dependence, unspecified, uncomplicated: Secondary | ICD-10-CM | POA: Insufficient documentation

## 2013-10-14 DIAGNOSIS — X58XXXA Exposure to other specified factors, initial encounter: Secondary | ICD-10-CM | POA: Insufficient documentation

## 2013-10-15 ENCOUNTER — Emergency Department (HOSPITAL_COMMUNITY): Payer: Medicaid Other

## 2013-10-15 ENCOUNTER — Encounter (HOSPITAL_COMMUNITY): Payer: Self-pay | Admitting: Emergency Medicine

## 2013-10-15 ENCOUNTER — Emergency Department (HOSPITAL_COMMUNITY)
Admission: EM | Admit: 2013-10-15 | Discharge: 2013-10-15 | Disposition: A | Discharge status: Home / Self Care | Payer: Medicaid Other | Attending: Emergency Medicine | Admitting: Emergency Medicine

## 2013-10-15 DIAGNOSIS — T148XXA Other injury of unspecified body region, initial encounter: Secondary | ICD-10-CM

## 2013-10-15 LAB — CBC
HCT: 29 % — ABNORMAL LOW (ref 36.0–46.0)
Hemoglobin: 9 g/dL — ABNORMAL LOW (ref 12.0–15.0)
MCH: 24.2 pg — AB (ref 26.0–34.0)
MCHC: 31 g/dL (ref 30.0–36.0)
MCV: 78 fL (ref 78.0–100.0)
PLATELETS: 392 10*3/uL (ref 150–400)
RBC: 3.72 MIL/uL — AB (ref 3.87–5.11)
RDW: 16.3 % — AB (ref 11.5–15.5)
WBC: 9 10*3/uL (ref 4.0–10.5)

## 2013-10-15 LAB — TROPONIN I: Troponin I: <0.3 ng/mL (ref <0.30)

## 2013-10-15 LAB — BASIC METABOLIC PANEL
Anion gap: 15 (ref 5–15)
BUN: 14 mg/dL (ref 6–23)
CALCIUM: 9.1 mg/dL (ref 8.4–10.5)
CO2: 23 mEq/L (ref 19–32)
CREATININE: 0.75 mg/dL (ref 0.50–1.10)
Chloride: 100 mEq/L (ref 96–112)
GFR calc Af Amer: >90 mL/min (ref >90)
GFR calc non Af Amer: >90 mL/min (ref >90)
Glucose, Bld: 89 mg/dL (ref 70–99)
Potassium: 3.6 mEq/L — ABNORMAL LOW (ref 3.7–5.3)
SODIUM: 138 meq/L (ref 137–147)

## 2013-10-15 MED ORDER — TRAMADOL HCL 50 MG PO TABS: 50.0000 mg | ORAL_TABLET | Freq: Four times a day (QID) | ORAL | Status: DC | PRN

## 2013-10-15 MED ORDER — IBUPROFEN 600 MG PO TABS: 600.0000 mg | ORAL_TABLET | Freq: Four times a day (QID) | ORAL | Status: DC | PRN

## 2013-10-15 MED ORDER — CYCLOBENZAPRINE HCL 10 MG PO TABS: 10.0000 mg | ORAL_TABLET | Freq: Two times a day (BID) | ORAL | Status: DC | PRN

## 2013-10-15 MED ORDER — IBUPROFEN 800 MG PO TABS
800.0000 mg | ORAL_TABLET | Freq: Once | ORAL | Status: AC
  Administered 2013-10-15: 800 mg via ORAL
  Filled 2013-10-15: qty 1

## 2013-10-15 MED ORDER — CYCLOBENZAPRINE HCL 10 MG PO TABS
5.0000 mg | ORAL_TABLET | Freq: Once | ORAL | Status: AC
  Administered 2013-10-15: 5 mg via ORAL
  Filled 2013-10-15: qty 1

## 2013-10-15 MED ORDER — TRAMADOL HCL 50 MG PO TABS
50.0000 mg | ORAL_TABLET | Freq: Once | ORAL | Status: AC
  Administered 2013-10-15: 50 mg via ORAL
  Filled 2013-10-15: qty 1

## 2013-10-15 NOTE — ED Notes (Signed)
Pt. reports left lateral ribcage pain worse with deep inspiration , right shoulder pain and bilateral hands tingling onset 2 days ago , denies SOB , nausea or diaphoresis .

## 2013-10-15 NOTE — ED Provider Notes (Signed)
CSN: [id number]     Arrival date & time 10/14/13  2352 History   First MD Initiated Contact with Patient 10/15/13 0151     Chief Complaint  Patient presents with  . Chest Pain     (Consider location/radiation/quality/duration/timing/severity/associated sxs/prior Treatment) HPI This patient is aN obese but otherwise healthy young woman who presents with complaints of right-sided neck pain. She points to the trapezius muscle on the right as site of pain. She denies trauma or strain. However, she does repetitive activity with her arms and hands at work. The pain as aching, sometimes radiates to right shoulder. The patient has not experienced any motor weakness or paresthesias. No recent trauma.  Patient also notes that after work, her thumb index and ring finger feel tingly bilaterally.   Past Medical History  Diagnosis Date  . Dysmenorrhea   . Anemia 06/29/2013   Past Surgical History  Procedure Laterality Date  . Cesarean section  2001  . Cesarean section with bilateral tubal ligation  2002   Family History  Problem Relation Age of Onset  . Diabetes Mother   . Breast cancer Paternal Aunt   . Diabetes Maternal Grandmother   . Hypertension Maternal Grandmother    History  Substance Use Topics  . Smoking status: Current Every Day Smoker -- 0.50 packs/day for 17 years    Types: Cigarettes  . Smokeless tobacco: Not on file     Comment: One pack every two days  . Alcohol Use: No   OB History   Grav Para Term Preterm Abortions TAB SAB Ect Mult Living   2 2 2       2      Review of Systems Ten point review of symptoms performed and is negative with the exception of symptoms noted above.     Allergies  Review of patient's allergies indicates no known allergies.  Home Medications   Prior to Admission medications   Not on File   BP 156/89  Pulse 78  Temp(Src) 98.3 F (36.8 C) (Oral)  Resp 20  Ht 5\' 3"  (1.6 m)  Wt 220 lb (99.791 kg)  BMI 38.98 kg/m2  SpO2 100%  LMP  10/07/2013 Physical Exam Gen: well developed and well nourished appearing Head: NCAT Eyes: PERL, EOMI Nose: no epistaixis or rhinorrhea Mouth/throat: mucosa is moist and pink Neck: no midline ttp, neck supple, palpable muscle spasm and ttp over the right trapezius m. With excacerbation of pain Lungs: CTA B, no wheezing, rhonchi or rales CV: RRR, no murmur, extremities appear well perfused.  Abd: soft, notender, nondistended Back: no ttp, no cva ttp Skin: warm and dry Ext: normal to inspection, no ttp, from x all 4 ext no dependent edema Neuro: CN ii-xii grossly intact, no focal deficits Psyche; normal affect,  calm and cooperative.   ED Course  Procedures (including critical care time) Labs Review  Results for orders placed during the hospital encounter of 10/15/13 (from the past 24 hour(s))  CBC     Status: Abnormal   Collection Time    10/15/13 12:50 AM      Result Value Ref Range   WBC 9.0  4.0 - 10.5 K/uL   RBC 3.72 (*) 3.87 - 5.11 MIL/uL   Hemoglobin 9.0 (*) 12.0 - 15.0 g/dL   HCT 40.929.0 (*) 81.136.0 - 91.446.0 %   MCV 78.0  78.0 - 100.0 fL   MCH 24.2 (*) 26.0 - 34.0 pg   MCHC 31.0  30.0 - 36.0 g/dL  RDW 16.3 (*) 11.5 - 15.5 %   Platelets 392  150 - 400 K/uL  BASIC METABOLIC PANEL     Status: Abnormal   Collection Time    10/15/13 12:50 AM      Result Value Ref Range   Sodium 138  137 - 147 mEq/L   Potassium 3.6 (*) 3.7 - 5.3 mEq/L   Chloride 100  96 - 112 mEq/L   CO2 23  19 - 32 mEq/L   Glucose, Bld 89  70 - 99 mg/dL   BUN 14  6 - 23 mg/dL   Creatinine, Ser 2.840.75  0.50 - 1.10 mg/dL   Calcium 9.1  8.4 - 13.210.5 mg/dL   GFR calc non Af Amer >90  >90 mL/min   GFR calc Af Amer >90  >90 mL/min   Anion gap 15  5 - 15  TROPONIN I     Status: None   Collection Time    10/15/13 12:50 AM      Result Value Ref Range   Troponin I <0.30  <0.30 ng/mL    Imaging Review Dg Ribs Unilateral W/chest Left  10/15/2013   CLINICAL DATA:  Chest pressure for 2 days  EXAM: LEFT RIBS AND  CHEST - 3+ VIEW  COMPARISON:  None.  FINDINGS: No fracture or other bone lesions are seen involving the ribs. There is no evidence of pneumothorax or pleural effusion. Both lungs are clear. Heart size and mediastinal contours are within normal limits.  IMPRESSION: Negative.   Electronically Signed   By: Signa Kellaylor  Stroud M.D.   On: 10/15/2013 01:27   EKG: nsr, no acute ischemic changes, normal intervals, normal axis, normal qrs complex MDM   DDX: muscle strain, shoulder sprain.   We will manage symptomatically. Patient is counseled re: results of ED testing and plan for outpatient tx with f/u.     Brandt LoosenJulie Manly, MD 10/15/13 [phone]

## 2013-12-24 ENCOUNTER — Encounter: Payer: Self-pay | Admitting: Gynecology

## 2014-02-11 ENCOUNTER — Emergency Department (HOSPITAL_COMMUNITY)
Admission: EM | Admit: 2014-02-11 | Discharge: 2014-02-12 | Disposition: A | Discharge status: Home / Self Care | Payer: Medicaid Other | Attending: Emergency Medicine | Admitting: Emergency Medicine

## 2014-02-11 ENCOUNTER — Encounter (HOSPITAL_COMMUNITY): Payer: Self-pay | Admitting: Emergency Medicine

## 2014-02-11 DIAGNOSIS — Z8742 Personal history of other diseases of the female genital tract: Secondary | ICD-10-CM | POA: Insufficient documentation

## 2014-02-11 DIAGNOSIS — J029 Acute pharyngitis, unspecified: Secondary | ICD-10-CM | POA: Insufficient documentation

## 2014-02-11 DIAGNOSIS — Z720 Tobacco use: Secondary | ICD-10-CM | POA: Insufficient documentation

## 2014-02-11 DIAGNOSIS — H9202 Otalgia, left ear: Secondary | ICD-10-CM | POA: Insufficient documentation

## 2014-02-11 DIAGNOSIS — Z862 Personal history of diseases of the blood and blood-forming organs and certain disorders involving the immune mechanism: Secondary | ICD-10-CM | POA: Insufficient documentation

## 2014-02-11 LAB — RAPID STREP SCREEN (MED CTR MEBANE ONLY): Streptococcus, Group A Screen (Direct): NEGATIVE

## 2014-02-11 NOTE — ED Provider Notes (Signed)
CSN: [id number]     Arrival date & time 02/11/14  2145 History   First MD Initiated Contact with Patient 02/11/14 2319     Chief Complaint  Patient presents with  . Sore Throat     (Consider location/radiation/quality/duration/timing/severity/associated sxs/prior Treatment) HPI  33 year old female presents with sore throat.  Since yesterday pt developed gradual onset of L sided sore throat radiates to L ear, persistent, worsening with eating and drinking.  Pt has tried otc cough medication with minimal relief  Denies fever, chills, headache, runny nose, sneeze, cough, hearing loss, cp, abd pain, sob, or rash.  No recent sick contact or travel.  No other complaints  Past Medical History  Diagnosis Date  . Dysmenorrhea   . Anemia 06/29/2013   Past Surgical History  Procedure Laterality Date  . Cesarean section  2001  . Cesarean section with bilateral tubal ligation  2002   Family History  Problem Relation Age of Onset  . Diabetes Mother   . Breast cancer Paternal Aunt   . Diabetes Maternal Grandmother   . Hypertension Maternal Grandmother    History  Substance Use Topics  . Smoking status: Current Every Day Smoker -- 0.50 packs/day for 17 years    Types: Cigarettes  . Smokeless tobacco: Not on file     Comment: One pack every two days  . Alcohol Use: No   OB History   Grav Para Term Preterm Abortions TAB SAB Ect Mult Living   2 2 2       2      Review of Systems  Constitutional: Negative for fever.  HENT: Positive for ear pain, sore throat and trouble swallowing. Negative for voice change.   Skin: Negative for rash.      Allergies  Review of patient's allergies indicates no known allergies.  Home Medications   Prior to Admission medications   Medication Sig Start Date End Date Taking? Authorizing Provider  cyclobenzaprine (FLEXERIL) 10 MG tablet Take 1 tablet (10 mg total) by mouth 2 (two) times daily as needed for muscle spasms. 10/15/13   Brandt LoosenJulie Manly, MD   ibuprofen (ADVIL,MOTRIN) 600 MG tablet Take 1 tablet (600 mg total) by mouth every 6 (six) hours as needed. 10/15/13   Brandt LoosenJulie Manly, MD  traMADol (ULTRAM) 50 MG tablet Take 1 tablet (50 mg total) by mouth every 6 (six) hours as needed. 10/15/13   Brandt LoosenJulie Manly, MD   BP 140/82  Pulse 92  Temp(Src) 98.3 F (36.8 C) (Oral)  Resp 18  Ht 5\' 3"  (1.6 m)  Wt 239 lb (108.41 kg)  BMI 42.35 kg/m2  SpO2 99%  LMP 02/11/2014 Physical Exam  Nursing note and vitals reviewed. Constitutional: She appears well-developed and well-nourished. No distress.  HENT:  Head: Atraumatic.  Right Ear: External ear normal.  Left Ear: External ear normal.  Mouth/Throat: Oropharynx is clear and moist. No oropharyngeal exudate.  Mouth: uvula midline, mild left post oropharyngeal erythema, no tonsillar enlargement or exudates.  No trismus or evidence of deep tissue infection.  Eyes: Conjunctivae are normal.  Neck: Neck supple.  Cardiovascular: Normal rate and regular rhythm.   Pulmonary/Chest: Effort normal and breath sounds normal.  Abdominal: There is no tenderness.  Lymphadenopathy:    She has cervical adenopathy.  Neurological: She is alert.  Skin: No rash noted.  Psychiatric: She has a normal mood and affect.    ED Course  Procedures (including critical care time)  12:00 AM Pt with sore throat, strep test  negative.  Likely viral in origin.  Will treat sxs.  ent referral as needed.  Return precaution discussed.    Labs Review Labs Reviewed  RAPID STREP SCREEN  CULTURE, GROUP A STREP    Imaging Review No results found.   EKG Interpretation None      MDM   Final diagnoses:  Viral pharyngitis    BP 140/82  Pulse 92  Temp(Src) 98.3 F (36.8 C) (Oral)  Resp 18  Ht 5\' 3"  (1.6 m)  Wt 239 lb (108.41 kg)  BMI 42.35 kg/m2  SpO2 99%  LMP 02/11/2014     Fayrene HelperBowie Eimy Plaza, PA-C 02/12/14 0002

## 2014-02-11 NOTE — ED Notes (Signed)
Sore throat since yesterday no temp.  Lt earache also  ,lmp this month

## 2014-02-12 MED ORDER — HYDROCODONE-HOMATROPINE 5-1.5 MG/5ML PO SYRP: 5.0000 mL | ORAL_SOLUTION | Freq: Four times a day (QID) | ORAL | Status: DC | PRN

## 2014-02-12 NOTE — ED Provider Notes (Signed)
Medical screening examination/treatment/procedure(s) were performed by non-physician practitioner and as supervising physician I was immediately available for consultation/collaboration.   EKG Interpretation None        Layla MawKristen N Ward, DO 02/12/14 0006

## 2014-02-12 NOTE — Discharge Instructions (Signed)

## 2014-02-13 LAB — CULTURE, GROUP A STREP

## 2014-02-14 ENCOUNTER — Encounter (HOSPITAL_COMMUNITY): Payer: Self-pay | Admitting: Emergency Medicine

## 2014-04-07 ENCOUNTER — Encounter (HOSPITAL_COMMUNITY): Payer: Self-pay | Admitting: Emergency Medicine

## 2014-04-07 ENCOUNTER — Emergency Department (HOSPITAL_COMMUNITY): Payer: Medicaid Other

## 2014-04-07 ENCOUNTER — Emergency Department (HOSPITAL_COMMUNITY)
Admission: EM | Admit: 2014-04-07 | Discharge: 2014-04-07 | Disposition: A | Discharge status: Home / Self Care | Payer: Self-pay | Attending: Emergency Medicine | Admitting: Emergency Medicine

## 2014-04-07 DIAGNOSIS — F22 Delusional disorders: Secondary | ICD-10-CM | POA: Diagnosis present

## 2014-04-07 DIAGNOSIS — Z8742 Personal history of other diseases of the female genital tract: Secondary | ICD-10-CM | POA: Insufficient documentation

## 2014-04-07 DIAGNOSIS — Z9889 Other specified postprocedural states: Secondary | ICD-10-CM | POA: Insufficient documentation

## 2014-04-07 DIAGNOSIS — K8020 Calculus of gallbladder without cholecystitis without obstruction: Secondary | ICD-10-CM | POA: Insufficient documentation

## 2014-04-07 DIAGNOSIS — D649 Anemia, unspecified: Secondary | ICD-10-CM | POA: Insufficient documentation

## 2014-04-07 DIAGNOSIS — Z9851 Tubal ligation status: Secondary | ICD-10-CM | POA: Insufficient documentation

## 2014-04-07 DIAGNOSIS — Z720 Tobacco use: Secondary | ICD-10-CM | POA: Insufficient documentation

## 2014-04-07 DIAGNOSIS — Z3202 Encounter for pregnancy test, result negative: Secondary | ICD-10-CM | POA: Insufficient documentation

## 2014-04-07 LAB — COMPREHENSIVE METABOLIC PANEL
ALT: 13 U/L (ref 0–35)
ANION GAP: 6 (ref 5–15)
AST: 33 U/L (ref 0–37)
Albumin: 3.4 g/dL — ABNORMAL LOW (ref 3.5–5.2)
Alkaline Phosphatase: 86 U/L (ref 39–117)
BUN: 13 mg/dL (ref 6–23)
CO2: 26 mmol/L (ref 19–32)
Calcium: 9.1 mg/dL (ref 8.4–10.5)
Chloride: 108 mEq/L (ref 96–112)
Creatinine, Ser: 0.83 mg/dL (ref 0.50–1.10)
GFR calc non Af Amer: >90 mL/min (ref >90)
GLUCOSE: 129 mg/dL — AB (ref 70–99)
POTASSIUM: 4.3 mmol/L (ref 3.5–5.1)
Sodium: 140 mmol/L (ref 135–145)
TOTAL PROTEIN: 6.8 g/dL (ref 6.0–8.3)
Total Bilirubin: 0.3 mg/dL (ref 0.3–1.2)

## 2014-04-07 LAB — POC URINE PREG, ED: Preg Test, Ur: NEGATIVE

## 2014-04-07 LAB — CBC WITH DIFFERENTIAL/PLATELET
Basophils Absolute: 0 10*3/uL (ref 0.0–0.1)
Basophils Relative: 0 % (ref 0–1)
EOS ABS: 0.1 10*3/uL (ref 0.0–0.7)
EOS PCT: 1 % (ref 0–5)
HCT: 29.2 % — ABNORMAL LOW (ref 36.0–46.0)
Hemoglobin: 9.1 g/dL — ABNORMAL LOW (ref 12.0–15.0)
LYMPHS ABS: 1.6 10*3/uL (ref 0.7–4.0)
LYMPHS PCT: 16 % (ref 12–46)
MCH: 23.8 pg — ABNORMAL LOW (ref 26.0–34.0)
MCHC: 31.2 g/dL (ref 30.0–36.0)
MCV: 76.2 fL — AB (ref 78.0–100.0)
MONOS PCT: 6 % (ref 3–12)
Monocytes Absolute: 0.6 10*3/uL (ref 0.1–1.0)
NEUTROS PCT: 77 % (ref 43–77)
Neutro Abs: 7.7 10*3/uL (ref 1.7–7.7)
PLATELETS: 340 10*3/uL (ref 150–400)
RBC: 3.83 MIL/uL — AB (ref 3.87–5.11)
RDW: 16.5 % — ABNORMAL HIGH (ref 11.5–15.5)
WBC: 10 10*3/uL (ref 4.0–10.5)

## 2014-04-07 LAB — LIPASE, BLOOD: Lipase: 24 U/L (ref 11–59)

## 2014-04-07 MED ORDER — DICYCLOMINE HCL 20 MG PO TABS
20.0000 mg | ORAL_TABLET | Freq: Two times a day (BID) | ORAL | Status: DC
Start: 2014-04-07 — End: 2014-12-27

## 2014-04-07 MED ORDER — GI COCKTAIL ~~LOC~~
30.0000 mL | Freq: Once | ORAL | Status: AC
  Administered 2014-04-07: 30 mL via ORAL
  Filled 2014-04-07: qty 30

## 2014-04-07 MED ORDER — IBUPROFEN 600 MG PO TABS: 600.0000 mg | ORAL_TABLET | Freq: Four times a day (QID) | ORAL | Status: DC | PRN

## 2014-04-07 NOTE — Discharge Instructions (Signed)
Watch your diet carefully. No greasy, fatty, spicy foods. Ibuprofen for severe pain. Bentyl for pain as well. If worsening return to ER otherwise follow up with surgery. Start iron supplements for anemia, follow up with GYN doctor.   Biliary Colic  Biliary colic is a steady or irregular pain in the upper abdomen. It is usually under the right side of the rib cage. It happens when gallstones interfere with the normal flow of bile from the gallbladder. Bile is a liquid that helps to digest fats. Bile is made in the liver and stored in the gallbladder. When you eat a meal, bile passes from the gallbladder through the cystic duct and the common bile duct into the small intestine. There, it mixes with partially digested food. If a gallstone blocks either of these ducts, the normal flow of bile is blocked. The muscle cells in the bile duct contract forcefully to try to move the stone. This causes the pain of biliary colic.  SYMPTOMS   A person with biliary colic usually complains of pain in the upper abdomen. This pain can be:  In the center of the upper abdomen just below the breastbone.  In the upper-right part of the abdomen, near the gallbladder and liver.  Spread back toward the right shoulder blade.  Nausea and vomiting.  The pain usually occurs after eating.  Biliary colic is usually triggered by the digestive system's demand for bile. The demand for bile is high after fatty meals. Symptoms can also occur when a person who has been fasting suddenly eats a very large meal. Most episodes of biliary colic pass after 1 to 5 hours. After the most intense pain passes, your abdomen may continue to ache mildly for about 24 hours. DIAGNOSIS  After you describe your symptoms, your caregiver will perform a physical exam. He or she will pay attention to the upper right portion of your belly (abdomen). This is the area of your liver and gallbladder. An ultrasound will help your caregiver look for gallstones.  Specialized scans of the gallbladder may also be done. Blood tests may be done, especially if you have fever or if your pain persists. PREVENTION  Biliary colic can be prevented by controlling the risk factors for gallstones. Some of these risk factors, such as heredity, increasing age, and pregnancy are a normal part of life. Obesity and a high-fat diet are risk factors you can change through a healthy lifestyle. Women going through menopause who take hormone replacement therapy (estrogen) are also more likely to develop biliary colic. TREATMENT   Pain medication may be prescribed.  You may be encouraged to eat a fat-free diet.  If the first episode of biliary colic is severe, or episodes of colic keep retuning, surgery to remove the gallbladder (cholecystectomy) is usually recommended. This procedure can be done through small incisions using an instrument called a laparoscope. The procedure often requires a brief stay in the hospital. Some people can leave the hospital the same day. It is the most widely used treatment in people troubled by painful gallstones. It is effective and safe, with no complications in more than 90% of cases.  If surgery cannot be done, medication that dissolves gallstones may be used. This medication is expensive and can take months or years to work. Only small stones will dissolve.  Rarely, medication to dissolve gallstones is combined with a procedure called shock-wave lithotripsy. This procedure uses carefully aimed shock waves to break up gallstones. In many people treated with this  procedure, gallstones form again within a few years. PROGNOSIS  If gallstones block your cystic duct or common bile duct, you are at risk for repeated episodes of biliary colic. There is also a 25% chance that you will develop a gallbladder infection(acute cholecystitis), or some other complication of gallstones within 10 to 20 years. If you have surgery, schedule it at a time that is  convenient for you and at a time when you are not sick. HOME CARE INSTRUCTIONS   Drink plenty of clear fluids.  Avoid fatty, greasy or fried foods, or any foods that make your pain worse.  Take medications as directed. SEEK MEDICAL CARE IF:   You develop a fever over 100.5 F (38.1 C).  Your pain gets worse over time.  You develop nausea that prevents you from eating and drinking.  You develop vomiting. SEEK IMMEDIATE MEDICAL CARE IF:   You have continuous or severe belly (abdominal) pain which is not relieved with medications.  You develop nausea and vomiting which is not relieved with medications.  You have symptoms of biliary colic and you suddenly develop a fever and shaking chills. This may signal cholecystitis. Call your caregiver immediately.  You develop a yellow color to your skin or the white part of your eyes (jaundice). Document Released: 09/02/2005 Document Revised: 06/24/2011 Document Reviewed: 11/12/2007 [facility] Patient Information 2015 [place], [place]. This information is not intended to replace advice given to you by your health care provider. Make sure you discuss any questions you have with your health care provider.

## 2014-04-07 NOTE — ED Notes (Signed)
Pt reports burning abdominal pain after eating fried chicken and hot sauce 2 hours ago. Pt also having nv.

## 2014-04-07 NOTE — ED Notes (Signed)
Pt A&Ox4, ambulatory at d/c with steady gait, NAD 

## 2014-04-07 NOTE — ED Provider Notes (Signed)
CSN: [id number]     Arrival date & time 04/07/14  1501 History   First MD Initiated Contact with Patient 04/07/14 1525     Chief Complaint  Patient presents with  . Abdominal Pain     (Consider location/radiation/quality/duration/timing/severity/associated sxs/prior Treatment) HPI Megan Parks is a 33 y.o. female with hx of dysmenorrhea, anemia, presents to ED with complaint of upper abdominal pain. Pt states she ate a spicy chicken about 4 hrs ago. States hour later developed severe "burning sensation in chest and upper abdomen."  States she tried taking tums, drinking milk, all with no relief. She reports similar pain in the past after eating, but never this severe. She reports nausea, vomiting. States pain has now lasted for about 3 hrs but started to subside. She denies fever, chills, blood in stool or emesis. No urinary symptoms. Denies pregnancy. No other complaints.    Past Medical History  Diagnosis Date  . Dysmenorrhea   . Anemia 06/29/2013   Past Surgical History  Procedure Laterality Date  . Cesarean section  2001  . Cesarean section with bilateral tubal ligation  2002   Family History  Problem Relation Age of Onset  . Diabetes Mother   . Breast cancer Paternal Aunt   . Diabetes Maternal Grandmother   . Hypertension Maternal Grandmother    History  Substance Use Topics  . Smoking status: Current Every Day Smoker -- 0.50 packs/day for 17 years    Types: Cigarettes  . Smokeless tobacco: Not on file     Comment: One pack every two days  . Alcohol Use: No   OB History    Gravida Para Term Preterm AB TAB SAB Ectopic Multiple Living   2 2 2       2      Review of Systems  Constitutional: Negative for fever and chills.  Respiratory: Negative for cough, chest tightness and shortness of breath.   Cardiovascular: Negative for chest pain, palpitations and leg swelling.  Gastrointestinal: Positive for nausea, vomiting and abdominal pain. Negative for diarrhea,  constipation and blood in stool.  Genitourinary: Negative for dysuria, flank pain and pelvic pain.  Musculoskeletal: Negative for myalgias, arthralgias, neck pain and neck stiffness.  Skin: Negative for rash.  Neurological: Negative for dizziness, weakness and headaches.  All other systems reviewed and are negative.     Allergies  Review of patient's allergies indicates no known allergies.  Home Medications   Prior to Admission medications   Medication Sig Start Date End Date Taking? Authorizing Provider  cyclobenzaprine (FLEXERIL) 10 MG tablet Take 1 tablet (10 mg total) by mouth 2 (two) times daily as needed for muscle spasms. Patient not taking: Reported on 04/07/2014 10/15/13   Brandt LoosenJulie Manly, MD  HYDROcodone-homatropine [facility](HYCODAN) 5-1.5 MG/5ML syrup Take 5 mLs by mouth every 6 (six) hours as needed for cough. Patient not taking: Reported on 04/07/2014 02/12/14   Fayrene HelperBowie Tran, PA-C  ibuprofen (ADVIL,MOTRIN) 600 MG tablet Take 1 tablet (600 mg total) by mouth every 6 (six) hours as needed. Patient not taking: Reported on 04/07/2014 10/15/13   Brandt LoosenJulie Manly, MD  traMADol (ULTRAM) 50 MG tablet Take 1 tablet (50 mg total) by mouth every 6 (six) hours as needed. Patient not taking: Reported on 04/07/2014 10/15/13   Brandt LoosenJulie Manly, MD   BP 140/79 mmHg  Pulse 79  Temp(Src) 98.2 F (36.8 C) (Oral)  Resp 18  SpO2 100%  LMP 03/21/2014 Physical Exam  Constitutional: She appears well-developed and well-nourished. No distress.  HENT:  Head: Normocephalic.  Eyes: Conjunctivae are normal.  Neck: Neck supple.  Cardiovascular: Normal rate, regular rhythm and normal heart sounds.   Pulmonary/Chest: Effort normal and breath sounds normal. No respiratory distress. She has no wheezes. She has no rales.  Abdominal: Soft. Bowel sounds are normal. She exhibits no distension. There is tenderness. There is no rebound.  RUQ, epigastric tenderness  Musculoskeletal: She exhibits no edema.  Neurological: She is  alert.  Skin: Skin is warm and dry.  Psychiatric: She has a normal mood and affect. Her behavior is normal.  Nursing note and vitals reviewed.   ED Course  Procedures (including critical care time) Labs Review Labs Reviewed  CBC WITH DIFFERENTIAL - Abnormal; Notable for the following:    RBC 3.83 (*)    Hemoglobin 9.1 (*)    HCT 29.2 (*)    MCV 76.2 (*)    MCH 23.8 (*)    RDW 16.5 (*)    All other components within normal limits  COMPREHENSIVE METABOLIC PANEL - Abnormal; Notable for the following:    Glucose, Bld 129 (*)    Albumin 3.4 (*)    All other components within normal limits  LIPASE, BLOOD  POC URINE PREG, ED    Imaging Review [place] Abdomen Complete  04/07/2014   CLINICAL DATA:  Acute upper abdominal pain  EXAM: ULTRASOUND ABDOMEN COMPLETE  COMPARISON:  None.  FINDINGS: Gallbladder: Small echogenic gallstones within the gallbladder fundus appearing mobile. Normal wall thickness measuring 1.3 mm. No Murphy sign.  Common bile duct: Diameter: 4.1 mm  Liver: No focal lesion identified. Within normal limits in parenchymal echogenicity.  IVC: No abnormality visualized.  Pancreas: Visualized portion unremarkable.  Spleen: Size and appearance within normal limits.  Right Kidney: Length: 11.5 cm. Echogenicity within normal limits. No mass or hydronephrosis visualized.  Left Kidney: Length: 11.2 cm. Echogenicity within normal limits. No mass or hydronephrosis visualized.  Abdominal aorta: No aneurysm visualized.  Other findings: None.  IMPRESSION: Cholelithiasis.  No other acute finding.   Electronically Signed   By: Ruel Favorsrevor  Shick M.D.   On: 04/07/2014 19:30     EKG Interpretation None      MDM   Final diagnoses:  Calculus of gallbladder without cholecystitis without obstruction  Anemia, unspecified anemia type    Patient with severe epigastric pain after eating spicy chicken, now improving. History of similar pain with it, just not as severe. Will get blood work, we'll try GI  cocktail, we will get ultrasound of abdomen, for evaluation of possible biliary colic.  7:57 PM US showing cholelithiasis, no cholesystitis. Pt is pain free. Home with ibuprofen, bentyl, careful diet control, follow up with [facility] [place] surgery. Ptis also anemic, hx of the same. States diagnosed with menorrhagia. Not taking her iron supplements. Asymptomatic for her anima. Will restart iron and fu with ob/gyn.   Filed Vitals:   04/07/14 1630 04/07/14 1700 04/07/14 1936 04/07/14 1937  BP: 101/74 122/75 119/96   Pulse: 82 79  87  Temp:      TempSrc:      Resp: 18 18    SpO2: 100% 99%  100%     Lottie Musselatyana A Marelyn Rouser, PA-C 04/07/14 1959  Doug SouSam Jacubowitz, MD 04/08/14 1943

## 2014-07-01 ENCOUNTER — Ambulatory Visit: Payer: BC Managed Care – PPO | Admitting: Gynecology

## 2014-09-27 ENCOUNTER — Ambulatory Visit: Payer: Self-pay | Admitting: Podiatry

## 2014-12-27 ENCOUNTER — Telehealth: Payer: Self-pay | Admitting: *Deleted

## 2014-12-27 ENCOUNTER — Ambulatory Visit (INDEPENDENT_AMBULATORY_CARE_PROVIDER_SITE_OTHER): Payer: Medicaid Other | Admitting: Podiatry

## 2014-12-27 ENCOUNTER — Encounter: Payer: Self-pay | Admitting: Podiatry

## 2014-12-27 DIAGNOSIS — L600 Ingrowing nail: Secondary | ICD-10-CM | POA: Diagnosis not present

## 2014-12-27 DIAGNOSIS — L603 Nail dystrophy: Secondary | ICD-10-CM

## 2014-12-27 MED ORDER — NEOMYCIN-POLYMYXIN-HC 3.5-10000-1 OT SOLN: OTIC | Status: DC

## 2014-12-27 NOTE — Telephone Encounter (Signed)
Pt's B/L 1,2nd toenail fragments sent to [facility] for KOH and note in comment IF THERE IS GROWTH PLEASE SEND FOR IDENTIFICATION.

## 2014-12-27 NOTE — Progress Notes (Signed)
   Subjective:    Patient ID: Megan Parks, female    DOB: 01/29/1981, 34 y.o.   MRN: [id number]  HPI: She presents today concerned about thickening and discoloration of her toenails. She states they're getting thicker and more painful with shoe gear and ambulation. She tried over-the-counter treatment plans but that have not worked. She has not sought professional care from dermatology or podiatry.    Review of Systems  All other systems reviewed and are negative.      Objective:   Physical Exam: She presents today 34 year old female vital signs stable alert and oriented 3 no acute distress. Pulses are strongly palpable. Neurologic sensorium is intact. Deep tendon reflexes are intact bilateral muscle strength +5 over 5 dorsiflexion plantar flexors and inverters everters on his musculature is intact. Orthopedic evaluation demonstrates all just distal to the ankle for range of motion without crepitation. Cutaneous evaluation demonstrates supple well-hydrated cutis with dry xerotic skin possible tinea pedis to the plantar aspect of the bilateral foot right greater than left thick yellow dystrophic darkened toenails hallux and second digit bilateral.        Assessment & Plan:  Assessment: Tinea pedis and probable onychomycosis with nail dystrophy bilateral foot.  Plan: We took samples of the nail and skin today to be sent for pathologic evaluation will follow up with her once this returns.

## 2014-12-28 LAB — KOH PREP

## 2014-12-29 ENCOUNTER — Telehealth: Payer: Self-pay | Admitting: *Deleted

## 2014-12-29 NOTE — Telephone Encounter (Signed)
Dr. Al Corpus reviewed pt's fungal culture 12/27/2014 as +, and requested pt make an appt to discuss results and treatment.  I informed pt of Dr. Geryl Rankins orders and transferred pt to schedulers.

## 2015-01-10 ENCOUNTER — Ambulatory Visit (INDEPENDENT_AMBULATORY_CARE_PROVIDER_SITE_OTHER): Payer: Medicaid Other | Admitting: Podiatry

## 2015-01-10 ENCOUNTER — Ambulatory Visit: Payer: Medicaid Other | Admitting: Podiatry

## 2015-01-10 ENCOUNTER — Encounter: Payer: Self-pay | Admitting: Podiatry

## 2015-01-10 VITALS — BP 123/77 | HR 89 | Resp 16

## 2015-01-10 DIAGNOSIS — Z79899 Other long term (current) drug therapy: Secondary | ICD-10-CM | POA: Diagnosis not present

## 2015-01-10 MED ORDER — TERBINAFINE HCL 250 MG PO TABS: 250.0000 mg | ORAL_TABLET | Freq: Every day | ORAL | Status: DC

## 2015-01-10 NOTE — Progress Notes (Signed)
She presents today for follow-up of her tinea pedis and onychomycosis. Her path result has come back today positive for fungus.  Objective: Vital signs are stable she is alert and oriented 3 positive onychomycosis and tinea pedis per KOH prep.  Assessment: Onychomycosis tinea pedis bilateral  Plan: Started her on Lamisil 250 mg tablets #30 one by mouth daily. Also requested a liver profile. Follow up with her in 1 month call sooner if questions or concerns should arise. We will notify her should her blood work back abnormal.

## 2015-01-17 ENCOUNTER — Telehealth: Payer: Self-pay | Admitting: *Deleted

## 2015-01-17 LAB — CBC WITH DIFFERENTIAL/PLATELET
Basophils Absolute: 0 10*3/uL (ref 0.0–0.1)
Basophils Relative: 0 % (ref 0–1)
EOS PCT: 2 % (ref 0–5)
Eosinophils Absolute: 0.2 10*3/uL (ref 0.0–0.7)
HEMATOCRIT: 28.9 % — AB (ref 36.0–46.0)
Hemoglobin: 8.8 g/dL — ABNORMAL LOW (ref 12.0–15.0)
LYMPHS PCT: 29 % (ref 12–46)
Lymphs Abs: 2.5 10*3/uL (ref 0.7–4.0)
MCH: 23.9 pg — AB (ref 26.0–34.0)
MCHC: 30.4 g/dL (ref 30.0–36.0)
MCV: 78.5 fL (ref 78.0–100.0)
MONO ABS: 0.8 10*3/uL (ref 0.1–1.0)
MONOS PCT: 9 % (ref 3–12)
MPV: 8.3 fL — ABNORMAL LOW (ref 8.6–12.4)
Neutro Abs: 5.1 10*3/uL (ref 1.7–7.7)
Neutrophils Relative %: 60 % (ref 43–77)
Platelets: 378 10*3/uL (ref 150–400)
RBC: 3.68 MIL/uL — AB (ref 3.87–5.11)
RDW: 16.2 % — AB (ref 11.5–15.5)
WBC: 8.5 10*3/uL (ref 4.0–10.5)

## 2015-01-17 LAB — HEPATIC FUNCTION PANEL
ALK PHOS: 82 U/L (ref 33–115)
ALT: 6 U/L (ref 6–29)
AST: 13 U/L (ref 10–30)
Albumin: 3.7 g/dL (ref 3.6–5.1)
TOTAL PROTEIN: 6.9 g/dL (ref 6.1–8.1)
Total Bilirubin: 0.2 mg/dL (ref 0.2–1.2)

## 2015-01-17 NOTE — Telephone Encounter (Addendum)
-----   Message from Elinor Parkinson, [place] sent at 01/17/2015  1:18 PM EDT ----- Blood work looks ok may continue medication.  Mailbox is full unable to leave a message.  Left message informing pt of orders.  Pt called states someone called from this office.  Left message informing pt to continue the medication the bloodwork was fine.  Informed pt of Dr. Geryl Rankins orders.

## 2015-01-19 ENCOUNTER — Telehealth: Payer: Self-pay | Admitting: *Deleted

## 2015-01-19 DIAGNOSIS — B079 Viral wart, unspecified: Secondary | ICD-10-CM

## 2015-01-20 NOTE — Telephone Encounter (Signed)
Entered in error

## 2015-02-14 ENCOUNTER — Encounter: Payer: Self-pay | Admitting: Podiatry

## 2015-02-14 ENCOUNTER — Ambulatory Visit (INDEPENDENT_AMBULATORY_CARE_PROVIDER_SITE_OTHER): Payer: Medicaid Other | Admitting: Podiatry

## 2015-02-14 VITALS — BP 149/82 | HR 89 | Resp 125

## 2015-02-14 DIAGNOSIS — L603 Nail dystrophy: Secondary | ICD-10-CM

## 2015-02-14 DIAGNOSIS — Z79899 Other long term (current) drug therapy: Secondary | ICD-10-CM

## 2015-02-14 MED ORDER — TERBINAFINE HCL 250 MG PO TABS: 250.0000 mg | ORAL_TABLET | Freq: Every day | ORAL | Status: DC

## 2015-02-14 NOTE — Progress Notes (Signed)
She presents today one month after the start of Lamisil therapy. She denies fever chills nausea vomiting muscle aches pains rashes or itching. She states that the toenail has not changed yet.  Objective: Vital signs are stable alert and oriented 3. Pulses are palpable. No change in the nail plate hallux right.  Assessment: Pain in limb secondary to onychomycosis 1 through 5 bilateral.  Plan: Continue the treatment of Lamisil 4 months. Started her on a 90 day dose today and requested more liver profiles. Follow up with her in 4 months. Should her profile come back abnormal we will notify her immediately.  Arbutus Pedodd Hyatt DPM

## 2015-02-18 LAB — HEPATIC FUNCTION PANEL
ALBUMIN: 3.5 g/dL — AB (ref 3.6–5.1)
ALK PHOS: 92 U/L (ref 33–115)
ALT: 6 U/L (ref 6–29)
AST: 12 U/L (ref 10–30)
BILIRUBIN TOTAL: 0.2 mg/dL (ref 0.2–1.2)
Bilirubin, Direct: <0.1 mg/dL (ref <=0.2)
TOTAL PROTEIN: 6.3 g/dL (ref 6.1–8.1)

## 2015-02-20 ENCOUNTER — Telehealth: Payer: Self-pay | Admitting: *Deleted

## 2015-02-20 NOTE — Telephone Encounter (Addendum)
-----   Message from Elinor ParkinsonMax T Hyatt, [place] sent at 02/20/2015  7:49 AM EST ----- Blood work looks good and may continue medication.  Left message informing pt of orders.

## 2015-05-14 ENCOUNTER — Encounter (HOSPITAL_COMMUNITY): Payer: Self-pay | Admitting: Emergency Medicine

## 2015-05-14 ENCOUNTER — Emergency Department (HOSPITAL_COMMUNITY)
Admission: EM | Admit: 2015-05-14 | Discharge: 2015-05-14 | Disposition: A | Discharge status: Home / Self Care | Payer: Medicaid Other | Attending: Emergency Medicine | Admitting: Emergency Medicine

## 2015-05-14 DIAGNOSIS — Z8742 Personal history of other diseases of the female genital tract: Secondary | ICD-10-CM | POA: Insufficient documentation

## 2015-05-14 DIAGNOSIS — Z8659 Personal history of other mental and behavioral disorders: Secondary | ICD-10-CM | POA: Insufficient documentation

## 2015-05-14 DIAGNOSIS — F17210 Nicotine dependence, cigarettes, uncomplicated: Secondary | ICD-10-CM | POA: Diagnosis not present

## 2015-05-14 DIAGNOSIS — Z862 Personal history of diseases of the blood and blood-forming organs and certain disorders involving the immune mechanism: Secondary | ICD-10-CM | POA: Insufficient documentation

## 2015-05-14 DIAGNOSIS — L259 Unspecified contact dermatitis, unspecified cause: Secondary | ICD-10-CM | POA: Diagnosis not present

## 2015-05-14 DIAGNOSIS — Z79899 Other long term (current) drug therapy: Secondary | ICD-10-CM | POA: Insufficient documentation

## 2015-05-14 DIAGNOSIS — R21 Rash and other nonspecific skin eruption: Secondary | ICD-10-CM | POA: Diagnosis present

## 2015-05-14 MED ORDER — DIPHENHYDRAMINE HCL 25 MG PO TABS: 25.0000 mg | ORAL_TABLET | Freq: Four times a day (QID) | ORAL | Status: DC

## 2015-05-14 MED ORDER — PREDNISONE 10 MG PO TABS: 10.0000 mg | ORAL_TABLET | Freq: Two times a day (BID) | ORAL | Status: DC

## 2015-05-14 NOTE — Discharge Instructions (Signed)
You have been seen today for a rash. Follow up with PCP as needed if symptoms do not improve. Return to ED should symptoms worsen. Try to switch detergents until you find one that does not irritate your skin. Take 25 mg of benadryl (also available over the counter) every 6 hours for the next three days.    Emergency Department Resource Guide 1) Find a Doctor and Pay Out of Pocket Although you won't have to find out who is covered by your insurance plan, it is a good idea to ask around and get recommendations. You will then need to call the office and see if the doctor you have chosen will accept you as a new patient and what types of options they offer for patients who are self-pay. Some doctors offer discounts or will set up payment plans for their patients who do not have insurance, but you will need to ask so you aren't surprised when you get to your appointment.  2) Contact Your Local Health Department Not all health departments have doctors that can see patients for sick visits, but many do, so it is worth a call to see if yours does. If you don't know where your local health department is, you can check in your phone book. The CDC also has a tool to help you locate your state's health department, and many state websites also have listings of all of their local health departments.  3) Find a Walk-in Clinic If your illness is not likely to be very severe or complicated, you may want to try a walk in clinic. These are popping up all over the country in pharmacies, drugstores, and shopping centers. They're usually staffed by nurse practitioners or physician assistants that have been trained to treat common illnesses and complaints. They're usually fairly quick and inexpensive. However, if you have serious medical issues or chronic medical problems, these are probably not your best option.  No Primary Care Doctor: - Call Health Connect at  [phone] - they can help you locate a primary care doctor that   accepts your insurance, provides certain services, etc. - Physician Referral Service- [phone]  Chronic Pain Problems: Organization         Address  Phone   Notes  [facility]  [phone] Patients need to be referred by their primary care doctor.   Medication Assistance: Organization         Address  Phone   Notes  [facility] Medication [facility] [address] [place] [place], [place] [address] [phone] --Must be a resident of [facility] -- Must have NO insurance coverage whatsoever (no Medicaid/ Medicare, etc.) -- The pt. MUST have a primary care doctor that directs their care regularly and follows them in the community   Med[facility]ist  [phone]   [employer]  [phone]    Agencies that provide inexpensive medical care: Organization         Address  Phone   Notes  [facility]  [phone]   [facility]    [phone]   [facility] [address] [place], [place] [address] [phone]   [facility] [address] [place], [place] [phone]   Planned Parenthood    [phone]   [facility]    [phone]   [facility]  [address], [place] Phone:  [phone], Fax:  [phone] Hours of Operation:  9 am - 6 pm, M-F.  Also accepts Medicaid/Medicare and self-pay.  [facility]  [address], [place] Phone: [phone], Fax: [phone]. Hours of Operation:  8:30 am - 5:30 pm, M-F.  Also accepts Medicaid and self-pay.  [facility] [place] [address] [place] Phone: [phone]   [facility] [address] Natasha Bence [place], [place] [phone], Ext. [phone] Mondays & Thursdays: 7-9 AM.  First 15 patients are seen on a first come, first serve basis.    Medicaid-accepting [facility] Providers:  Organization          Address  Phone   Notes  [facility] [address], [place] [phone] Also accepts self-pay patients.  [facility] [address] Laurell Josephs [place], [place]  [phone]   [facility] [address], [place] [phone]   [facility] [address] [place] [phone]   Renaye Rakers [address], [place]   [phone] Only accepts [place] Access [place] patients after they have their name applied to their card.   Self-Pay (no insurance) in [facility]:  Organization         Address  Phone   Notes  Sickle Cell Patients, [facility] [address] [place], [place] [phone]   [facility] [address] [place], [place] [phone]   [facility] [place]  [address], [place] [phone]   [facility]/Dr. Osei-Bonsu  [address] [place] or [address], [place] [phone] Phone number for both [place] and [place] locations is the same.  [facility] [address] [place] [phone]   [facility] [address], [place] or [address] [phone]   [facility] [address], [place] [phone], phone; [phone], fax Sees patients 1st and 3rd Saturday of every month.  Must not qualify for public or private insurance (i.e. Medicaid, Medicare, [place] Health Choice, Veterans' Benefits)  Household income should be no more than 200% of the poverty level The clinic cannot treat you if you are pregnant or think you are pregnant  Sexually transmitted diseases are not treated at the clinic.    Dental Care: Organization         Address  Phone  Notes  [facility] Department of [facility] [address] [place],  [place] [phone] Accepts children up to age 17 who are enrolled in [place] or [place] Health Choice; pregnant women with a Medicaid card; and children who have applied for Medicaid or [place] Health Choice, but were declined, whose parents can pay a reduced fee at time of service.  [facility] Department of [facility]  [address], [place] [phone] Accepts children up to age 42 who are enrolled in [place] or [place] Health Choice; pregnant women with a Medicaid card; and children who have applied for Medicaid or [place] Health Choice, but were declined, whose parents can pay a reduced fee at time of service.  [facility]  [address] [place], [place] [phone] Patients are seen by appointment only. Walk-ins are not accepted. [facility] will see patients 54 years of age and older. Monday - Tuesday (8am-5pm) Most Wednesdays (8:30-5pm) $30 per visit, cash only  [facility] Adult Dental Access PROGRAM  [address], [facility] [phone] Patients are seen by appointment only. Walk-ins are not accepted. [place] will see patients 55 years of age and older. One Wednesday Evening (Monthly: Volunteer Based).  $30 per visit, cash only  [place]  [phone] for adults; Children under age 20, call Graduate Pediatric Dentistry at [phone]. Children aged 41-14, please call [phone] to request a pediatric application.  Dental services are provided in all areas of dental care including fillings, crowns and bridges, complete and partial dentures, implants, gum treatment, root canals, and extractions. Preventive care is also provided. Treatment is provided to both adults and children. Patients are selected via a lottery and there is often a waiting list.   [facility] [place] [address], [place]  [phone] [email or web address]   [facility] [address] [place], [place]  [phone], Ext. [phone] Second and Fourth Thursday of each month, opens at 6:30 AM; Clinic ends at 9 AM.  Patients are seen on a first-come first-served basis, and a limited number are seen during each clinic.   [facility]  [address] Hillard Danker [place], [place] [phone]   Eligibility Requirements You must have lived in [place], [place], or [place] counties for at least the last three months.   You cannot be eligible for state or federal sponsored [employer], including [employer], [place], or [employer].   You generally cannot be eligible for healthcare insurance through your employer.    How to apply: Eligibility screenings are held every Tuesday and Wednesday afternoon from 1:00 pm until 4:00 pm. You do not need an appointment for the interview!  [facility] [address] [place], [place]   [place]  [place] Department  [place]  [phone]    Behavioral Health Resources in the Community: Intensive Outpatient Programs Organization         Address  Phone  Notes  [place]. [address] [place], [place] [phone]   [facility] Outpatient [address] [place], [place]   ADS: [facility] [address] [place], [place]   [place] [address]  [place], [place] or [phone]   Substance Abuse Resources Organization         Address  Phone  Notes  [facility] Drug Services  [phone]   [place]  [phone]   The [place]   Chinita Pester  [phone]   Residential & Outpatient Substance Abuse Program  [phone]   Psychological Services Organization         Address  Phone  Notes  [facility] [place]  [place]  [phone]    [place] [address], [place] or [phone]    Mobile Crisis Teams Organization         Address  Phone  Notes  [facility], Mobile Crisis Care Unit  [phone]   [facility]  [address] [place], [place]   [facility] [address] [place] [phone]    Self-Help/Support Groups Organization  Address  Phone             Notes  [place]. of [place] - variety of support groups  [place] Call for more information  Narcotics Anonymous (NA), [facility] [address], [employer] [place]  2 meetings at this location   Special educational needs teacher         Address  Phone  Notes  [facility] [place],    [place]  [phone]   [facility]  [address] [place] [address], [place], [place]   [place], [place] [phone] Admissions: 8am-3pm M-F  Incentives Substance [place] [address],    [place], [place] [phone]   [facility] [address] [place], [place], [place]   [facility] [address]  [place], [place]   [facility] Programs - Intensive Outpatient [address] Kristeen Mans [address], [place], [place]   [facility].,  [place], [place] [phone] or [phone]   [facility] (RTS) [address] [place], [place] Accepts Medicaid  [facility] [place] [address]  [place] [phone] Substance Abuse/Addiction Treatment   [facility] Organization         Address  Phone  Notes  [facility]  [phone]   Domenic Schwab, PhD [address] Arlis Porta [place], [place]   [phone] or [phone]   [place]  [place], [place] [phone]   [facility] [address] [place], [place] [phone] Insurance/Medicaid/sponsorship through [facility] and Families [address] [place]                                    [place], [place] [phone] [place] [address], [place] [phone]    Dr. Adele Schilder  [phone]   [facility] Dept. 1) [address], [place] 2) [place] 3)  [place] [address], [place] [phone]  [phone]   [place] [phone] or [phone] (After Hours)

## 2015-05-14 NOTE — ED Provider Notes (Signed)
CSN: [id number]     Arrival date & time 05/14/15  1952 History  By signing my name below, I, Megan Parks, attest that this documentation has been prepared under the direction and in the presence of non-physician practitioner, Megan Rutherford, PA-C. Electronically Signed: Freida Parks, Scribe. 05/14/2015. 8:27 PM.    Chief Complaint  Patient presents with  . Rash   The history is provided by the patient. No language interpreter was used.     HPI Comments:  Megan Parks is a 35 y.o. female who presents to the Emergency Department complaining of a pruritic rash to her torso and BUE x 3 days. Pt recently changed laundry detergent to tide pods. After rash erupted, pt re-washed clothing in Dreft detergent but denies change to rash after doing so. She has also applied benadryl cream with little relief. She denies pain, nausea, vomiting, SOB, or any other new allergens.   Patient specifically denies any new medications.   Past Medical History  Diagnosis Date  . Dysmenorrhea   . Anemia 06/29/2013  . 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 Cesarean section  2001  . Cesarean section with bilateral tubal ligation  2002   Family History  Problem Relation Age of Onset  . Diabetes Mother   . Breast cancer Paternal Aunt   . Diabetes Maternal Grandmother   . Hypertension Maternal Grandmother    Social History  Substance Use Topics  . Smoking status: Current Every Day Smoker -- 0.50 packs/day for 17 years    Types: Cigarettes  . Smokeless tobacco: None     Comment: One pack every two days  . Alcohol Use: No   OB History    Gravida Para Term Preterm AB TAB SAB Ectopic Multiple Living   Review of Systems  Constitutional: Negative for fever, diaphoresis and fatigue.  Respiratory: Negative for shortness of breath.   Cardiovascular: Negative for chest pain.  Gastrointestinal: Negative for nausea, vomiting and abdominal pain.   Skin: Positive for rash.    Allergies  Review of patient's allergies indicates no known allergies.  Home Medications   Prior to Admission medications   Medication Sig Start Date End Date Taking? Authorizing Provider  cyclobenzaprine (FLEXERIL) 10 MG tablet Take 1 tablet (10 mg total) by mouth 2 (two) times daily as needed for muscle spasms. 10/15/13   Megan Loosen, MD  diphenhydrAMINE (BENADRYL) 25 MG tablet Take 1 tablet (25 mg total) by mouth every 6 (six) hours. 05/14/15   Megan Dohrmann C Marca Gadsby, PA-C  ibuprofen (ADVIL,MOTRIN) 600 MG tablet Take 1 tablet (600 mg total) by mouth every 6 (six) hours as needed. 04/07/14   Megan Kirichenko, PA-C  neomycin-polymyxin-hydrocortisone (CORTISPORIN) otic solution Apply to affected areas on toes twice daily 12/27/14   Megan Parks, Megan Parks  predniSONE (DELTASONE) 10 MG tablet Take 1 tablet (10 mg total) by mouth 2 (two) times daily with a meal. 05/14/15   Megan Deshazer C Eddie Koc, PA-C  terbinafine (LAMISIL) 250 MG tablet Take 1 tablet (250 mg total) by mouth daily. 02/14/15   Megan Parks, Megan Parks  traMADol (ULTRAM) 50 MG tablet Take 1 tablet (50 mg total) by mouth every 6 (six) hours as needed. 10/15/13   Megan Loosen, MD   BP 150/93 mmHg  Pulse 84  Temp(Src) 98.3 F (36.8 C) (Oral)  Resp 16  SpO2 97%  LMP 04/30/2015 Physical Exam  Constitutional: She is oriented to person, place, and time. She appears well-developed and well-nourished. No distress.  HENT:  Head: Normocephalic and atraumatic.  Eyes: Conjunctivae are normal.  Cardiovascular: Normal rate.   Pulmonary/Chest: Effort normal.  Abdominal: She exhibits no distension.  Neurological: She is alert and oriented to person, place, and time.  Skin: Skin is warm and dry. Rash noted.  Diffuse macular rash across trunk and BUE. No crusting or pustules  Psychiatric: She has a normal mood and affect.  Nursing note and vitals reviewed.   ED Course  Procedures   DIAGNOSTIC STUDIES:  Oxygen Saturation is 97% on RA, normal  by my interpretation.    COORDINATION OF CARE:  8:18 PM Will discharge with benadryl, steroids and referral to PCP. Discussed treatment plan with pt at bedside and pt agreed to plan.    MDM   Final diagnoses:  Contact dermatitis    Megan Parks presents with a macular rash that began 3 days ago when she changed laundry detergents.   Patient with contact dermatitis. Instructed to avoid offending agent and to use unscented soaps, lotions, and detergents. Will treat with benadryl, steroids and discharge with referral to PCP.  No signs of secondary infection. Patient has had no changes in her medications to give evidence for a drug reaction. Patient has no signs of anaphylaxis. Pt instructed to follow up with PCP if symptoms do not improve. Return precautions discussed. Pt appears safe for discharge at this time.   I personally performed the services described in this documentation, which was scribed in my presence. The recorded information has been reviewed and is accurate.  Megan Pancoast, PA-C 05/14/15 2103  Megan Bucco, MD 05/14/15 2329

## 2015-05-14 NOTE — ED Notes (Signed)
C/o generalized rash x 2 days.  She thought it may be related to laundry detergent so she rewashed clothes in Dreft.

## 2015-05-18 ENCOUNTER — Emergency Department (HOSPITAL_COMMUNITY)
Admission: EM | Admit: 2015-05-18 | Discharge: 2015-05-18 | Disposition: A | Discharge status: Home / Self Care | Payer: Medicaid Other | Attending: Emergency Medicine | Admitting: Emergency Medicine

## 2015-05-18 ENCOUNTER — Encounter (HOSPITAL_COMMUNITY): Payer: Self-pay | Admitting: *Deleted

## 2015-05-18 DIAGNOSIS — Z862 Personal history of diseases of the blood and blood-forming organs and certain disorders involving the immune mechanism: Secondary | ICD-10-CM | POA: Insufficient documentation

## 2015-05-18 DIAGNOSIS — Z8659 Personal history of other mental and behavioral disorders: Secondary | ICD-10-CM | POA: Diagnosis not present

## 2015-05-18 DIAGNOSIS — Z8742 Personal history of other diseases of the female genital tract: Secondary | ICD-10-CM | POA: Diagnosis not present

## 2015-05-18 DIAGNOSIS — F17210 Nicotine dependence, cigarettes, uncomplicated: Secondary | ICD-10-CM | POA: Diagnosis not present

## 2015-05-18 DIAGNOSIS — Z79899 Other long term (current) drug therapy: Secondary | ICD-10-CM | POA: Diagnosis not present

## 2015-05-18 DIAGNOSIS — R21 Rash and other nonspecific skin eruption: Secondary | ICD-10-CM | POA: Diagnosis present

## 2015-05-18 DIAGNOSIS — Z7952 Long term (current) use of systemic steroids: Secondary | ICD-10-CM | POA: Insufficient documentation

## 2015-05-18 DIAGNOSIS — Z792 Long term (current) use of antibiotics: Secondary | ICD-10-CM | POA: Insufficient documentation

## 2015-05-18 DIAGNOSIS — L309 Dermatitis, unspecified: Secondary | ICD-10-CM

## 2015-05-18 MED ORDER — PERMETHRIN 5 % EX CREA: TOPICAL_CREAM | CUTANEOUS | Status: DC

## 2015-05-18 MED ORDER — HYDROXYZINE HCL 25 MG PO TABS: 25.0000 mg | ORAL_TABLET | Freq: Four times a day (QID) | ORAL | Status: DC

## 2015-05-18 NOTE — ED Notes (Signed)
States she was here for a rash last week, prescribed benadryl and prednisone. States she rash has continued to spread.

## 2015-05-18 NOTE — ED Provider Notes (Signed)
CSN: [id number]     Arrival date & time 05/18/15  2324 History  By signing my name below, I, [name], attest that this documentation has been prepared under the direction and in the presence of [name], MD. Electronically Signed: [name], ED Scribe. 05/18/2015. 11:41 PM.    Chief Complaint  Patient presents with  . Rash      Patient is a 35 y.o. female presenting with rash. The history is provided by the patient. No language interpreter was used.  Rash  HPI Comments: Megan Parks is a 35 y.o. female who presents to the Emergency Department complaining of  constant, worsening, itching rash that has been present for about a week. Pt reports she was seen in ED on 1/29 for the same symptoms and given Prednizone and Benadryl to treat her symptoms with no relief. Pt states she thought her rash began from changing her washing detergent, but states she went back to her old one after she was last seen in ED, and rashes are still there. Pt reports today with rash to her BUE, abdomen, and back. Pt denies any trouble breathing, no known allergies, and denies eating any new food recently.   Past Medical History  Diagnosis Date  . Dysmenorrhea   . Anemia 06/29/2013  . 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 Cesarean section  2001  . Cesarean section with bilateral tubal ligation  2002   Family History  Problem Relation Age of Onset  . Diabetes Mother   . Breast cancer Paternal Aunt   . Diabetes Maternal Grandmother   . Hypertension Maternal Grandmother    Social History  Substance Use Topics  . Smoking status: Current Every Day Smoker -- 0.50 packs/day for 17 years    Types: Cigarettes  . Smokeless tobacco: None     Comment: One pack every two days  . Alcohol Use: No   OB History    Gravida Para Term Preterm AB TAB SAB Ectopic Multiple Living   Review of Systems  Skin: Positive for rash.    A  complete 10 system review of systems was obtained and all systems are negative except as noted in the HPI and PMH.    Allergies  Review of patient's allergies indicates no known allergies.  Home Medications   Prior to Admission medications   Medication Sig Start Date End Date Taking? Authorizing Provider  cyclobenzaprine (FLEXERIL) 10 MG tablet Take 1 tablet (10 mg total) by mouth 2 (two) times daily as needed for muscle spasms. 10/15/13   Brandt Loosen, MD  diphenhydrAMINE (BENADRYL) 25 MG tablet Take 1 tablet (25 mg total) by mouth every 6 (six) hours. 05/14/15   Shawn C Joy, PA-C  ibuprofen (ADVIL,MOTRIN) 600 MG tablet Take 1 tablet (600 mg total) by mouth every 6 (six) hours as needed. 04/07/14   Tatyana Kirichenko, PA-C  neomycin-polymyxin-hydrocortisone (CORTISPORIN) otic solution Apply to affected areas on toes twice daily 12/27/14   Max T Hyatt, DPM  predniSONE (DELTASONE) 10 MG tablet Take 1 tablet (10 mg total) by mouth 2 (two) times daily with a meal. 05/14/15   Shawn C Joy, PA-C  terbinafine (LAMISIL) 250 MG tablet Take 1 tablet (250 mg total) by mouth daily. 02/14/15   Max T Hyatt, DPM  traMADol (ULTRAM) 50 MG tablet Take 1 tablet (50 mg total) by mouth every 6 (six)  hours as needed. 10/15/13   Brandt Loosen, MD   BP 151/97 mmHg  Pulse 108  Temp(Src) 98.2 F (36.8 C) (Oral)  Resp 18  SpO2 100%  LMP 04/30/2015 Physical Exam  Constitutional: She is oriented to person, place, and time. She appears well-developed and well-nourished. No distress.  HENT:  Head: Normocephalic and atraumatic.  Eyes: EOM are normal.  Neck: Normal range of motion.  Cardiovascular: Normal rate, regular rhythm and normal heart sounds.   Pulmonary/Chest: Effort normal and breath sounds normal.  Abdominal: Soft. She exhibits no distension. There is no tenderness.  Musculoskeletal: Normal range of motion.  Neurological: She is alert and oriented to person, place, and time.  Skin: Skin is warm and dry.  There  are multiple punctate, excoriations to the abdomen and back. These are erythematous and blanching.   Psychiatric: She has a normal mood and affect. Judgment normal.  Nursing note and vitals reviewed.   ED Course  Procedures (including critical care time)  DIAGNOSTIC STUDIES: Oxygen Saturation is 100% on room air, normal by my interpretation.    COORDINATION OF CARE: 11:36 PM Discussed treatment plan with pt at bedside and pt agreed to plan.   Labs Review Labs Reviewed - No data to display  Imaging Review No results found. I have personally reviewed and evaluated these images and lab results as part of my medical decision-making.    MDM   Final diagnoses:  None    Patient presents with a rash to her back and chest. She was treated with steroids and histamine's but did not improve. The rash is very itchy. It does not appear to be hives, and there are multiple excoriated areas to the back and chest. My concern is possibly scabies. She will be treated with permethrin cream, Atarax, and when necessary follow-up with dermatology if this does not fix her symptoms.  I personally performed the services described in this documentation, which was scribed in my presence. The recorded information has been reviewed and is accurate.       [name], MD 05/19/15 [phone]

## 2015-05-18 NOTE — Discharge Instructions (Signed)
Permethrin cream as prescribed.  Hydroxyzine:  25 mg every six hours as needed for itching.   Rash A rash is a change in the color or texture of the skin. There are many different types of rashes. You may have other problems that accompany your rash. CAUSES   Infections.  Allergic reactions. This can include allergies to pets or foods.  Certain medicines.  Exposure to certain chemicals, soaps, or cosmetics.  Heat.  Exposure to poisonous plants.  Tumors, both cancerous and noncancerous. SYMPTOMS   Redness.  Scaly skin.  Itchy skin.  Dry or cracked skin.  Bumps.  Blisters.  Pain. DIAGNOSIS  Your caregiver may do a physical exam to determine what type of rash you have. A skin sample (biopsy) may be taken and examined under a microscope. TREATMENT  Treatment depends on the type of rash you have. Your caregiver may prescribe certain medicines. For serious conditions, you may need to see a skin doctor (dermatologist). HOME CARE INSTRUCTIONS   Avoid the substance that caused your rash.  Do not scratch your rash. This can cause infection.  You may take cool baths to help stop itching.  Only take over-the-counter or prescription medicines as directed by your caregiver.  Keep all follow-up appointments as directed by your caregiver. SEEK IMMEDIATE MEDICAL CARE IF:  You have increasing pain, swelling, or redness.  You have a fever.  You have new or severe symptoms.  You have body aches, diarrhea, or vomiting.  Your rash is not better after 3 days. MAKE SURE YOU:  Understand these instructions.  Will watch your condition.  Will get help right away if you are not doing well or get worse.   This information is not intended to replace advice given to you by your health care provider. Make sure you discuss any questions you have with your health care provider.   Document Released: 03/22/2002 Document Revised: 04/22/2014 Document Reviewed: 08/17/2014 Elsevier  Interactive Patient Education [employer]

## 2015-05-23 ENCOUNTER — Encounter (HOSPITAL_COMMUNITY): Payer: Self-pay | Admitting: Emergency Medicine

## 2015-05-23 ENCOUNTER — Emergency Department (HOSPITAL_COMMUNITY)
Admission: EM | Admit: 2015-05-23 | Discharge: 2015-05-23 | Disposition: A | Discharge status: Home / Self Care | Payer: Medicaid Other | Attending: Emergency Medicine | Admitting: Emergency Medicine

## 2015-05-23 DIAGNOSIS — Z862 Personal history of diseases of the blood and blood-forming organs and certain disorders involving the immune mechanism: Secondary | ICD-10-CM | POA: Insufficient documentation

## 2015-05-23 DIAGNOSIS — Z79899 Other long term (current) drug therapy: Secondary | ICD-10-CM | POA: Insufficient documentation

## 2015-05-23 DIAGNOSIS — L740 Miliaria rubra: Secondary | ICD-10-CM

## 2015-05-23 DIAGNOSIS — Z8742 Personal history of other diseases of the female genital tract: Secondary | ICD-10-CM | POA: Insufficient documentation

## 2015-05-23 DIAGNOSIS — R21 Rash and other nonspecific skin eruption: Secondary | ICD-10-CM | POA: Insufficient documentation

## 2015-05-23 DIAGNOSIS — F17210 Nicotine dependence, cigarettes, uncomplicated: Secondary | ICD-10-CM | POA: Diagnosis not present

## 2015-05-23 DIAGNOSIS — Z792 Long term (current) use of antibiotics: Secondary | ICD-10-CM | POA: Diagnosis not present

## 2015-05-23 DIAGNOSIS — Z7952 Long term (current) use of systemic steroids: Secondary | ICD-10-CM | POA: Insufficient documentation

## 2015-05-23 NOTE — Discharge Instructions (Signed)
Ms. Megan Parks,  Nice meeting you! Please follow-up with a dermatologist. Return to the emergency department if you develop shortness of breath, facial swelling, feeling like your throat is closing. Feel better soon!  S. Lane Hacker, PA-C

## 2015-05-23 NOTE — ED Notes (Signed)
Pt here for generalized itching rash' pt seen here for same in past

## 2015-05-23 NOTE — ED Provider Notes (Signed)
CSN: [id number]     Arrival date & time 05/23/15  1816 History  By signing my name below, I, Ronney Lion, attest that this documentation has been prepared under the direction and in the presence of S. Lane Hacker, PA-C. Electronically Signed: Ronney Lion, ED Scribe. 05/23/2015. 8:02 PM.    Chief Complaint  Patient presents with  . Rash   The history is provided by the patient. No language interpreter was used.    HPI Comments: Megan Parks is a 35 y.o. female with no pertinent PMHx, who presents to the Emergency Department complaining of a gradual-onset, constant, spreading, generalized, pruritic rash on her abdomen, back,neck, and legs that began 2 weeks ago. Patient was seen at the ED here twice, about 5 days ago and 9 days ago, but states the rash has been persisting despite being treated with Benadryl, prednisone, Atarax, and permethrin cream. Patient has tried changing her sheets with no relief. She denies wearing any new clothing and denies any new food intake. Patient states she is the only one in her entire house with these symptoms. She states she has not seen a dermatologist.  Past Medical History  Diagnosis Date  . Dysmenorrhea   . Anemia 06/29/2013   Past Surgical History  Procedure Laterality Date  . Cesarean section  2001  . Cesarean section with bilateral tubal ligation  2002   Family History  Problem Relation Age of Onset  . Diabetes Mother   . Breast cancer Paternal Aunt   . Diabetes Maternal Grandmother   . Hypertension Maternal Grandmother    Social History  Substance Use Topics  . Smoking status: Current Every Day Smoker -- 0.50 packs/day for 17 years    Types: Cigarettes  . Smokeless tobacco: None     Comment: One pack every two days  . Alcohol Use: No   OB History    Gravida Para Term Preterm AB TAB SAB Ectopic Multiple Living   Review of Systems  Constitutional: Negative for fever and chills.  Skin: Positive for rash.   Ten systems  are reviewed and are negative for acute change except as noted in the HPI  Allergies  Review of patient's allergies indicates no known allergies.  Home Medications   Prior to Admission medications   Medication Sig Start Date End Date Taking? Authorizing Provider  cyclobenzaprine (FLEXERIL) 10 MG tablet Take 1 tablet (10 mg total) by mouth 2 (two) times daily as needed for muscle spasms. 10/15/13   Brandt Loosen, MD  diphenhydrAMINE (BENADRYL) 25 MG tablet Take 1 tablet (25 mg total) by mouth every 6 (six) hours. 05/14/15   Shawn C Joy, PA-C  hydrOXYzine (ATARAX/VISTARIL) 25 MG tablet Take 1 tablet (25 mg total) by mouth every 6 (six) hours. 05/18/15   [name], MD  ibuprofen (ADVIL,MOTRIN) 600 MG tablet Take 1 tablet (600 mg total) by mouth every 6 (six) hours as needed. 04/07/14   Tatyana Kirichenko, PA-C  neomycin-polymyxin-hydrocortisone (CORTISPORIN) otic solution Apply to affected areas on toes twice daily 12/27/14   Max T Hyatt, DPM  permethrin (ELIMITE) 5 % cream Apply to affected area once 05/18/15   [name], MD  predniSONE (DELTASONE) 10 MG tablet Take 1 tablet (10 mg total) by mouth 2 (two) times daily with a meal. 05/14/15   Shawn C Joy, PA-C  terbinafine (LAMISIL) 250 MG tablet Take 1 tablet (250 mg total) by mouth daily. 02/14/15  Max T Hyatt, DPM  traMADol (ULTRAM) 50 MG tablet Take 1 tablet (50 mg total) by mouth every 6 (six) hours as needed. 10/15/13   Brandt Loosen, MD   BP 182/89 mmHg  Pulse 99  Temp(Src) 98.7 F (37.1 C) (Oral)  Resp 18  SpO2 100%  LMP 04/30/2015 Physical Exam  Constitutional: She is oriented to person, place, and time. She appears well-developed and well-nourished. No distress.  HENT:  Head: Normocephalic and atraumatic.  Eyes: Conjunctivae and EOM are normal.  Neck: Neck supple. No tracheal deviation present.  Cardiovascular: Normal rate.   Pulmonary/Chest: Effort normal. No respiratory distress.  Musculoskeletal: Normal range of motion.   Neurological: She is alert and oriented to person, place, and time.  Skin: Skin is warm and dry. Rash noted.  There are multiple punctate, excoriations to the abdomen and back. These are erythematous and blanching.   Psychiatric: She has a normal mood and affect. Her behavior is normal.  Nursing note and vitals reviewed.   ED Course  Procedures   DIAGNOSTIC STUDIES: Oxygen Saturation is 100% on RA, normal by my interpretation.    COORDINATION OF CARE: 7:38 PM - Discussed treatment plan with pt at bedside. Pt verbalized understanding and agreed to plan.   MDM   Final diagnoses:  Heat rash   Patient with nonspecific eruption. No signs of infection. Suspect heat rash. Instructed pt to follow up with dermatologist.   I personally performed the services described in this documentation, which was scribed in my presence. The recorded information has been reviewed and is accurate.   Melton Krebs, PA-C 05/25/15 2115  Cathren Laine, MD 05/28/15 [phone]

## 2015-06-15 ENCOUNTER — Encounter (INDEPENDENT_AMBULATORY_CARE_PROVIDER_SITE_OTHER): Payer: Medicaid Other | Admitting: Podiatry

## 2015-06-15 NOTE — Progress Notes (Signed)
This encounter was created in error - please disregard.

## 2015-06-27 ENCOUNTER — Encounter (HOSPITAL_COMMUNITY): Payer: Self-pay

## 2015-06-27 ENCOUNTER — Emergency Department (HOSPITAL_COMMUNITY)
Admission: EM | Admit: 2015-06-27 | Discharge: 2015-06-27 | Disposition: A | Discharge status: Home / Self Care | Payer: Medicaid Other | Attending: Emergency Medicine | Admitting: Emergency Medicine

## 2015-06-27 DIAGNOSIS — Z79899 Other long term (current) drug therapy: Secondary | ICD-10-CM | POA: Diagnosis not present

## 2015-06-27 DIAGNOSIS — Z862 Personal history of diseases of the blood and blood-forming organs and certain disorders involving the immune mechanism: Secondary | ICD-10-CM | POA: Insufficient documentation

## 2015-06-27 DIAGNOSIS — R21 Rash and other nonspecific skin eruption: Secondary | ICD-10-CM | POA: Diagnosis present

## 2015-06-27 DIAGNOSIS — Z7952 Long term (current) use of systemic steroids: Secondary | ICD-10-CM | POA: Diagnosis not present

## 2015-06-27 DIAGNOSIS — F17210 Nicotine dependence, cigarettes, uncomplicated: Secondary | ICD-10-CM | POA: Diagnosis not present

## 2015-06-27 DIAGNOSIS — Z8742 Personal history of other diseases of the female genital tract: Secondary | ICD-10-CM | POA: Diagnosis not present

## 2015-06-27 NOTE — ED Notes (Signed)
Pt has had generalized, itching rash x 2 months. [facility]. Pt states [facility] Rocky LinkSzabo. Pt reports, if we fax the referral, they will get her in today.

## 2015-06-27 NOTE — Discharge Instructions (Signed)
You [place] [facility] for reevaluation of your rash as requested. Return to ED for new or worsening symptoms as we discussed.

## 2015-06-27 NOTE — ED Notes (Signed)
Patient here with ongoing rash and wanting referral to dermatology

## 2015-06-27 NOTE — ED Provider Notes (Signed)
History  By signing my name below, I, Megan Parks, attest that this documentation has been prepared under the direction and in the presence of [employer], PA-C. Electronically Signed: Karle Parks, ED Scribe. 06/27/2015. 11:35 AM.  Chief Complaint  Patient presents with  . Rash   The history is provided by the patient and medical records. No language interpreter was used.    HPI Comments:  Megan Parks is a 35 y.o. female who presents to the Emergency Department complaining of an itching rash to the lower back and abdomen that began about 2 months ago in which she has been seen here multiple other times for. She states she made an appt to follow up with a dermatologist and the appt was yesterday but the provider did not take Medicaid. Pt reports all she needs is a referral to Megan Parks at [facility] in [facility] because they accept Medicaid. She states she recently changed all her soaps, lotions, creams and detergents to unscented and hypoallergenic formulas. She denies modifying factors. She denies any recent travel. She denies being bitten by any known insects. She denies SOB, nausea, vomiting, abdominal pain, vaginal bleeding or discharge.   Past Medical History  Diagnosis Date  . Dysmenorrhea   . Anemia 06/29/2013   Past Surgical History  Procedure Laterality Date  . Cesarean section  2001  . Cesarean section with bilateral tubal ligation  2002   Family History  Problem Relation Age of Onset  . Diabetes Mother   . Breast cancer Paternal Aunt   . Diabetes Maternal Grandmother   . Hypertension Maternal Grandmother    Social History  Substance Use Topics  . Smoking status: Current Every Day Smoker -- 0.50 packs/day for 17 years    Types: Cigarettes  . Smokeless tobacco: None     Comment: One pack every two days  . Alcohol Use: No   OB History    Gravida Para Term Preterm AB TAB SAB Ectopic Multiple Living   Review of Systems A complete  10 system review of systems was obtained and all systems are negative except as noted in the HPI and PMH.   Allergies  Review of patient's allergies indicates no known allergies.  Home Medications   Prior to Admission medications   Medication Sig Start Date End Date Taking? Authorizing Provider  cyclobenzaprine (FLEXERIL) 10 MG tablet Take 1 tablet (10 mg total) by mouth 2 (two) times daily as needed for muscle spasms. 10/15/13   Megan Loosen, MD  diphenhydrAMINE (BENADRYL) 25 MG tablet Take 1 tablet (25 mg total) by mouth every 6 (six) hours. 05/14/15   Megan C Joy, PA-C  hydrOXYzine (ATARAX/VISTARIL) 25 MG tablet Take 1 tablet (25 mg total) by mouth every 6 (six) hours. 05/18/15   Megan Lyons, MD  ibuprofen (ADVIL,MOTRIN) 600 MG tablet Take 1 tablet (600 mg total) by mouth every 6 (six) hours as needed. 04/07/14   Megan Kirichenko, PA-C  neomycin-polymyxin-hydrocortisone (CORTISPORIN) otic solution Apply to affected areas on toes twice daily 12/27/14   Megan Parks, DPM  permethrin (ELIMITE) 5 % cream Apply to affected area once 05/18/15   Megan Lyons, MD  predniSONE (DELTASONE) 10 MG tablet Take 1 tablet (10 mg total) by mouth 2 (two) times daily with a meal. 05/14/15   Megan C Joy, PA-C  terbinafine (LAMISIL) 250 MG tablet Take 1 tablet (250 mg total) by mouth daily. 02/14/15  Megan Parks, DPM  traMADol (ULTRAM) 50 MG tablet Take 1 tablet (50 mg total) by mouth every 6 (six) hours as needed. 10/15/13   Megan LoosenJulie Manly, MD   Triage Vitals: BP 142/108 mmHg  Pulse 98  Temp(Src) 98.3 F (36.8 C) (Oral)  Resp 18  Ht 5\' 4"  (1.626 m)  Wt 238 lb 11.2 oz (108.274 kg)  BMI 40.95 kg/m2  SpO2 99% Physical Exam  Constitutional: She is oriented to person, place, and time. She appears well-developed and well-nourished.  HENT:  Head: Normocephalic and atraumatic.  Eyes: EOM are normal.  Neck: Normal range of motion.  Cardiovascular: Normal rate, regular rhythm and normal heart sounds.  Exam reveals no  gallop and no friction rub.   No murmur heard. Pulmonary/Chest: Effort normal.  Abdominal: Soft. There is no tenderness.  Musculoskeletal: Normal range of motion.  Neurological: She is alert and oriented to person, place, and time.  Skin: Skin is warm and dry. Rash noted.  Diffuse maculopapular rash of trunk, back and extremities that are small, 2-3 mm in diameter, mostly circumferential areas of hyperpigmentation with no overlying erythema. No drainage. No mucous membrane involvement. No rash on palms or soles of feet.  Psychiatric: She has a normal mood and affect. Her behavior is normal.  Nursing note and vitals reviewed.   ED Course  Procedures (including critical care time) DIAGNOSTIC STUDIES: Oxygen Saturation is 99% on RA, normal by my interpretation.   COORDINATION OF CARE: 11:23 AM- Will put Dr. Hulen Parks's information on discharge paperwork so pt can follow up. Pt verbalizes understanding and agrees to plan.  Medications - No data to display   MDM  Megan Parks is a 35 y.o. female presents for evaluation of ongoing rash over the past 2 months. States she's been seen multiple times in the emergency department and is tried Benadryl, hydroxyzine, topical steroids and oral steroids as well as antifungals. Say she has [place] [facility] [facility] [place], came in today for referral to dermatologist. No new objective findings on exam today. No evidence of anaphylaxis or other emergent pathology. Stable for outpatient follow-up. Referral given. Final diagnoses:  Rash    I personally performed the services described in this documentation, which was scribed in my presence. The recorded information has been reviewed and is accurate.     Megan PeekBenjamin Serah Nicoletti, PA-C 06/27/15 1510  Megan MondayErin Schlossman, MD 06/28/15 1146

## 2016-06-12 ENCOUNTER — Encounter (HOSPITAL_COMMUNITY): Payer: Self-pay | Admitting: Emergency Medicine

## 2016-06-12 DIAGNOSIS — Z79899 Other long term (current) drug therapy: Secondary | ICD-10-CM | POA: Insufficient documentation

## 2016-06-12 DIAGNOSIS — F17210 Nicotine dependence, cigarettes, uncomplicated: Secondary | ICD-10-CM | POA: Insufficient documentation

## 2016-06-12 DIAGNOSIS — R51 Headache: Secondary | ICD-10-CM | POA: Insufficient documentation

## 2016-06-12 MED ORDER — ACETAMINOPHEN 325 MG PO TABS
ORAL_TABLET | ORAL | Status: AC
  Filled 2016-06-12: qty 2

## 2016-06-12 MED ORDER — ACETAMINOPHEN 325 MG PO TABS
650.0000 mg | ORAL_TABLET | Freq: Once | ORAL | Status: AC
  Administered 2016-06-12: 650 mg via ORAL

## 2016-06-12 NOTE — ED Triage Notes (Signed)
Pt presenting with headache x 2 days; pt denies focal deficits, denies vision changes/photophobia, denies n/v; pt states she has tried OTC pain relievers without success

## 2016-06-13 ENCOUNTER — Emergency Department (HOSPITAL_COMMUNITY)
Admission: EM | Admit: 2016-06-13 | Discharge: 2016-06-13 | Disposition: A | Discharge status: Home / Self Care | Payer: Medicaid Other | Attending: Emergency Medicine | Admitting: Emergency Medicine

## 2016-06-13 DIAGNOSIS — R519 Headache, unspecified: Secondary | ICD-10-CM

## 2016-06-13 DIAGNOSIS — R51 Headache: Secondary | ICD-10-CM

## 2016-06-13 MED ORDER — METOCLOPRAMIDE HCL 5 MG/ML IJ SOLN
10.0000 mg | Freq: Once | INTRAMUSCULAR | Status: AC
  Administered 2016-06-13: 10 mg via INTRAVENOUS
  Filled 2016-06-13: qty 2

## 2016-06-13 MED ORDER — DIPHENHYDRAMINE HCL 50 MG/ML IJ SOLN
50.0000 mg | Freq: Once | INTRAMUSCULAR | Status: AC
  Administered 2016-06-13: 50 mg via INTRAVENOUS
  Filled 2016-06-13: qty 1

## 2016-06-13 MED ORDER — KETOROLAC TROMETHAMINE 30 MG/ML IJ SOLN
30.0000 mg | Freq: Once | INTRAMUSCULAR | Status: AC
  Administered 2016-06-13: 30 mg via INTRAVENOUS
  Filled 2016-06-13: qty 1

## 2016-06-13 NOTE — ED Provider Notes (Signed)
[facility]-EMERGENCY DEPT Provider Note   CSN: [id number] Arrival date & time: 06/12/16  2215  By signing my name below, I, Bing Neighbors., attest that this documentation has been prepared under the direction and in the presence of Tomasita Crumble, MD. Electronically signed: Bing Neighbors., ED Scribe. 06/13/16. 1:06 AM.   History   Chief Complaint Chief Complaint  Patient presents with  . Headache    HPI Megan Parks is a 36 y.o. female with hx of headaches who presents to the Emergency Department complaining of constant mild to moderate headache with sudden onset x2 days. Pt states that she has had a headache localized behind her eyes for the past x2 days. Pt reports photophobia. She has taken Ibuprofen and Advil with no relief. Pt denies vision changes, nausea, vomiting, fever, cough, weakness/numbness in arms and legs. She denies sick contacts.   The history is provided by the patient. No language interpreter was used.    Past Medical History:  Diagnosis Date  . Anemia 06/29/2013  . Dysmenorrhea     Patient Active Problem List   Diagnosis Date Noted  . 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 Menorrhagia with irregular cycle 06/29/2013  . Smoker 06/29/2013  . Anemia 06/29/2013  . Obesity (BMI 30-39.9) 06/29/2013    Past Surgical History:  Procedure Laterality Date  . CESAREAN SECTION  2001  . CESAREAN SECTION WITH BILATERAL TUBAL LIGATION  2002    OB History    Gravida Para Term Preterm AB Living   2 2 2     2    SAB TAB Ectopic Multiple Live Births           2       Home Medications    Prior to Admission medications   Medication Sig Start Date End Date Taking? Authorizing Provider  cyclobenzaprine (FLEXERIL) 10 MG tablet Take 1 tablet (10 mg total) by mouth 2 (two) times daily as needed for muscle spasms. 10/15/13   Brandt Loosen, MD  diphenhydrAMINE (BENADRYL) 25 MG tablet Take 1 tablet (25 mg total) by mouth every 6 (six) hours. 05/14/15   Shawn C  Joy, PA-C  hydrOXYzine (ATARAX/VISTARIL) 25 MG tablet Take 1 tablet (25 mg total) by mouth every 6 (six) hours. 05/18/15   [name], MD  ibuprofen (ADVIL,MOTRIN) 600 MG tablet Take 1 tablet (600 mg total) by mouth every 6 (six) hours as needed. 04/07/14   Tatyana Kirichenko, PA-C  neomycin-polymyxin-hydrocortisone (CORTISPORIN) otic solution Apply to affected areas on toes twice daily 12/27/14   Max T Hyatt, DPM  permethrin (ELIMITE) 5 % cream Apply to affected area once 05/18/15   [name], MD  predniSONE (DELTASONE) 10 MG tablet Take 1 tablet (10 mg total) by mouth 2 (two) times daily with a meal. 05/14/15   Shawn C Joy, PA-C  terbinafine (LAMISIL) 250 MG tablet Take 1 tablet (250 mg total) by mouth daily. 02/14/15   Max T Hyatt, DPM  traMADol (ULTRAM) 50 MG tablet Take 1 tablet (50 mg total) by mouth every 6 (six) hours as needed. 10/15/13   Brandt Loosen, MD    Family History Family History  Problem Relation Age of Onset  . Diabetes Mother   . Breast cancer Paternal Aunt   . Diabetes Maternal Grandmother   . Hypertension Maternal Grandmother     Social History Social History  Substance Use Topics  . Smoking status: Current Every Day Smoker    Packs/day: 0.50    Years: 17.00  Types: Cigarettes  . Smokeless tobacco: Never Used     Comment: One pack every two days  . Alcohol use No     Allergies   Patient has no known allergies.   Review of Systems Review of Systems  A complete 10 system review of systems was obtained and all systems are negative except as noted in the HPI and PMH.   Physical Exam Updated Vital Signs BP 140/92   Pulse 94   Temp 98.3 F (36.8 C) (Oral)   Resp 16   Ht 5\' 3"  (1.6 m)   LMP 05/06/2016   SpO2 100%   Physical Exam  Constitutional: She is oriented to person, place, and time. She appears well-developed and well-nourished. No distress.  HENT:  Head: Normocephalic and atraumatic.  Nose: Nose normal.  Mouth/Throat: Oropharynx is clear and  moist. No oropharyngeal exudate.  Eyes: Conjunctivae and EOM are normal. Pupils are equal, round, and reactive to light. No scleral icterus.  Neck: Normal range of motion. Neck supple. No JVD present. No tracheal deviation present. No thyromegaly present.  Cardiovascular: Normal rate, regular rhythm and normal heart sounds.  Exam reveals no gallop and no friction rub.   No murmur heard. Pulmonary/Chest: Effort normal and breath sounds normal. No respiratory distress. She has no wheezes. She exhibits no tenderness.  Abdominal: Soft. Bowel sounds are normal. She exhibits no distension and no mass. There is no tenderness. There is no rebound and no guarding.  Musculoskeletal: Normal range of motion. She exhibits no edema or tenderness.  Lymphadenopathy:    She has no cervical adenopathy.  Neurological: She is alert and oriented to person, place, and time. No cranial nerve deficit or sensory deficit. She exhibits normal muscle tone.  Normal strength and sensation in all extremities, normal cerebellar testing.    Skin: Skin is warm and dry. No rash noted. No erythema. No pallor.  Nursing note and vitals reviewed.    ED Treatments / Results   DIAGNOSTIC STUDIES: Oxygen Saturation is 100% on RA, normal by my interpretation.   COORDINATION OF CARE: 1:06 AM-Discussed next steps with pt. Pt verbalized understanding and is agreeable with the plan.    Labs (all labs ordered are listed, but only abnormal results are displayed) Labs Reviewed - No data to display  EKG  EKG Interpretation None       Radiology No results found.  Procedures Procedures (including critical care time)  Medications Ordered in ED Medications  acetaminophen (TYLENOL) 325 MG tablet (not administered)  acetaminophen (TYLENOL) tablet 650 mg (650 mg Oral Given 06/12/16 2225)     Initial Impression / Assessment and Plan / ED Course  I have reviewed the triage vital signs and the nursing notes.  Pertinent  labs & imaging results that were available during my care of the patient were reviewed by me and considered in my medical decision making (see chart for details).     Patient presents to the ED for headache.  No other symptoms associated.  She was given toradol, reglan, benadryl and her symptoms resolved. She feels much better.  Neuro exam remains normal.  VS remain within her normal limits and she is safe for DC.  Final Clinical Impressions(s) / ED Diagnoses   Final diagnoses:  None    New Prescriptions New Prescriptions   No medications on file   I personally performed the services described in this documentation, which was scribed in my presence. The recorded information has been reviewed and is accurate.  Tomasita CrumbleAdeleke Franky Reier, MD 06/13/16 [phone]

## 2016-06-26 ENCOUNTER — Emergency Department (HOSPITAL_COMMUNITY)
Admission: EM | Admit: 2016-06-26 | Discharge: 2016-06-26 | Disposition: A | Discharge status: Home / Self Care | Payer: Medicaid Other | Attending: Emergency Medicine | Admitting: Emergency Medicine

## 2016-06-26 ENCOUNTER — Encounter (HOSPITAL_COMMUNITY): Payer: Self-pay | Admitting: Emergency Medicine

## 2016-06-26 DIAGNOSIS — Y9301 Activity, walking, marching and hiking: Secondary | ICD-10-CM | POA: Insufficient documentation

## 2016-06-26 DIAGNOSIS — F17210 Nicotine dependence, cigarettes, uncomplicated: Secondary | ICD-10-CM | POA: Insufficient documentation

## 2016-06-26 DIAGNOSIS — Z79899 Other long term (current) drug therapy: Secondary | ICD-10-CM | POA: Insufficient documentation

## 2016-06-26 DIAGNOSIS — Y999 Unspecified external cause status: Secondary | ICD-10-CM | POA: Insufficient documentation

## 2016-06-26 DIAGNOSIS — Y929 Unspecified place or not applicable: Secondary | ICD-10-CM | POA: Insufficient documentation

## 2016-06-26 DIAGNOSIS — X501XXA Overexertion from prolonged static or awkward postures, initial encounter: Secondary | ICD-10-CM | POA: Insufficient documentation

## 2016-06-26 DIAGNOSIS — M545 Low back pain, unspecified: Secondary | ICD-10-CM

## 2016-06-26 LAB — PREGNANCY, URINE: PREG TEST UR: NEGATIVE

## 2016-06-26 MED ORDER — NAPROXEN 500 MG PO TABS: 500.0000 mg | ORAL_TABLET | Freq: Two times a day (BID) | ORAL | 0 refills | Status: DC

## 2016-06-26 MED ORDER — METHOCARBAMOL 500 MG PO TABS: 500.0000 mg | ORAL_TABLET | Freq: Two times a day (BID) | ORAL | 0 refills | Status: DC

## 2016-06-26 MED ORDER — NAPROXEN 250 MG PO TABS
500.0000 mg | ORAL_TABLET | Freq: Once | ORAL | Status: AC
  Administered 2016-06-26: 500 mg via ORAL
  Filled 2016-06-26: qty 2

## 2016-06-26 NOTE — ED Provider Notes (Signed)
[facility]-EMERGENCY DEPT Provider Note    By signing my name below, I, Earmon Phoenix, attest that this documentation has been prepared under the direction and in the presence of Melburn Hake, PA-C. Electronically Signed: Earmon Phoenix, ED Scribe. 06/26/16. 6:30 PM.    History   Chief Complaint Chief Complaint  Patient presents with  . Back Pain    The history is provided by the patient and medical records. No language interpreter was used.    Megan Parks is an obese 36 y.o. female who presents to the Emergency Department complaining of moderate, sharp, cramping, non radiating low back pain that began 6 days ago. She has taken Aleve and Advil for pain with no significant relief. Bending, walking or other movements increase the pain. She denies alleviating factors. She denies numbness, tingling or weakness of the lower extremities, bowel or bladder incontinence, bruising, wounds, fever, chills, nausea, vomiting, abdominal pain, diarrhea, CP, SOB, saddle anesthesia, dysuria, hematuria. She denies any trauma, injury or fall. She denies any past injury of her back. LMP was 06/19/16 and denies chance of pregnancy stating she has had a tubal ligation. She states she works two jobs and is on her feet for long periods of time. She does not have a PCP.   Past Medical History:  Diagnosis Date  . Anemia 06/29/2013  . Dysmenorrhea     Patient Active Problem List   Diagnosis Date Noted  . 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 Menorrhagia with irregular cycle 06/29/2013  . Smoker 06/29/2013  . Anemia 06/29/2013  . Obesity (BMI 30-39.9) 06/29/2013    Past Surgical History:  Procedure Laterality Date  . CESAREAN SECTION  2001  . CESAREAN SECTION WITH BILATERAL TUBAL LIGATION  2002    OB History    Gravida Para Term Preterm AB Living   2 2 2     2    SAB TAB Ectopic Multiple Live Births           2       Home Medications    Prior to Admission medications   Medication Sig Start  Date End Date Taking? Authorizing Provider  cyclobenzaprine (FLEXERIL) 10 MG tablet Take 1 tablet (10 mg total) by mouth 2 (two) times daily as needed for muscle spasms. 10/15/13   Brandt Loosen, MD  diphenhydrAMINE (BENADRYL) 25 MG tablet Take 1 tablet (25 mg total) by mouth every 6 (six) hours. 05/14/15   Shawn C Joy, PA-C  hydrOXYzine (ATARAX/VISTARIL) 25 MG tablet Take 1 tablet (25 mg total) by mouth every 6 (six) hours. 05/18/15   [name], MD  ibuprofen (ADVIL,MOTRIN) 600 MG tablet Take 1 tablet (600 mg total) by mouth every 6 (six) hours as needed. 04/07/14   Tatyana Kirichenko, PA-C  methocarbamol (ROBAXIN) 500 MG tablet Take 1 tablet (500 mg total) by mouth 2 (two) times daily. 06/26/16   Barrett Henle, PA-C  naproxen (NAPROSYN) 500 MG tablet Take 1 tablet (500 mg total) by mouth 2 (two) times daily. 06/26/16   Barrett Henle, PA-C  neomycin-polymyxin-hydrocortisone (CORTISPORIN) otic solution Apply to affected areas on toes twice daily 12/27/14   Max T Dennison, DPM  permethrin (ELIMITE) 5 % cream Apply to affected area once 05/18/15   [name], MD  predniSONE (DELTASONE) 10 MG tablet Take 1 tablet (10 mg total) by mouth 2 (two) times daily with a meal. 05/14/15   Shawn C Joy, PA-C  terbinafine (LAMISIL) 250 MG tablet Take 1 tablet (250 mg total) by mouth daily.  02/14/15   Max T Hyatt, DPM  traMADol (ULTRAM) 50 MG tablet Take 1 tablet (50 mg total) by mouth every 6 (six) hours as needed. 10/15/13   Brandt Loosen, MD    Family History Family History  Problem Relation Age of Onset  . Diabetes Mother   . Breast cancer Paternal Aunt   . Diabetes Maternal Grandmother   . Hypertension Maternal Grandmother     Social History Social History  Substance Use Topics  . Smoking status: Current Every Day Smoker    Packs/day: 0.50    Years: 17.00    Types: Cigarettes  . Smokeless tobacco: Never Used     Comment: One pack every two days  . Alcohol use No     Allergies   Patient  has no known allergies.   Review of Systems Review of Systems  Constitutional: Negative for chills and fever.  Eyes: Negative for visual disturbance.  Respiratory: Negative for shortness of breath.   Cardiovascular: Negative for chest pain.  Gastrointestinal: Negative for abdominal pain, diarrhea, nausea and vomiting.       No bowel or bladder incontinence  Genitourinary: Negative for dysuria and hematuria.  Musculoskeletal: Positive for back pain.  Skin: Negative for color change and wound.  Allergic/Immunologic: Negative for immunocompromised state.  Neurological: Negative for weakness and numbness.  Hematological: Negative for adenopathy.     Physical Exam Updated Vital Signs BP 143/93 (BP Location: Left Arm)   Pulse 93   Temp 98 F (36.7 C) (Oral)   Resp 16   Ht 5\' 3"  (1.6 m)   Wt 238 lb (108 kg)   SpO2 100%   BMI 42.16 kg/m   Physical Exam  Constitutional: She is oriented to person, place, and time. She appears well-developed and well-nourished.  HENT:  Head: Normocephalic and atraumatic.  Eyes: Conjunctivae and EOM are normal. Right eye exhibits no discharge. Left eye exhibits no discharge. No scleral icterus.  Neck: Normal range of motion. Neck supple.  Cardiovascular: Normal rate, regular rhythm, normal heart sounds and intact distal pulses.  Exam reveals no gallop and no friction rub.   No murmur heard. Pulmonary/Chest: Effort normal and breath sounds normal. No respiratory distress. She has no wheezes. She has no rales. She exhibits no tenderness.  Abdominal: Soft. Bowel sounds are normal. She exhibits no distension and no mass. There is no tenderness. There is no rebound, no guarding and no CVA tenderness.  Musculoskeletal: She exhibits tenderness. She exhibits no edema or deformity.  No midline C, T, or L tenderness. Mild tenderness to palpation over bilateral lumbar paraspinal muscles. Full range of motion of neck and back. Full range of motion of bilateral  upper and lower extremities, with 5/5 strength. Sensation intact. 2+ radial and PT pulses. Cap refill <2 seconds. Patient able to stand and ambulate without assistance.  Neurological: She is alert and oriented to person, place, and time. She displays normal reflexes.  Skin: Skin is warm and dry.  Nursing note and vitals reviewed.    ED Treatments / Results  DIAGNOSTIC STUDIES: Oxygen Saturation is 100% on RA, normal by my interpretation.   COORDINATION OF CARE: 6:24 PM- Will check urine pregnancy test and prescribe muscle relaxer. Pt verbalizes understanding and agrees to plan.  Medications  naproxen (NAPROSYN) tablet 500 mg (not administered)     Labs (all labs ordered are listed, but only abnormal results are displayed) Labs Reviewed  PREGNANCY, URINE  POC URINE PREG, ED    EKG  EKG Interpretation None       Radiology No results found.  Procedures Procedures (including critical care time)  Medications Ordered in ED Medications  naproxen (NAPROSYN) tablet 500 mg (not administered)     Initial Impression / Assessment and Plan / ED Course  I have reviewed the triage vital signs and the nursing notes.  Pertinent labs & imaging results that were available during my care of the patient were reviewed by me and considered in my medical decision making (see chart for details).     Patient is a 36 y.o. female who presents to the ED with back pain. No neurological deficits appreciated. Patient is ambulatory. No warning symptoms of back pain including: fecal incontinence, urinary retention or overflow incontinence, night sweats, waking from sleep with back pain, unexplained fevers or weight loss, h/o cancer, IVDU, recent trauma. No concern for cauda equina, epidural abscess, or other serious cause of back pain. Negative pregnancy. Conservative measures such as rest, ice/heat and pain medicine indicated with PCP follow-up if no improvement with conservative management.     I personally performed the services described in this documentation, which was scribed in my presence. The recorded information has been reviewed and is accurate.   Final Clinical Impressions(s) / ED Diagnoses   Final diagnoses:  Acute bilateral low back pain without sciatica    New Prescriptions New Prescriptions   METHOCARBAMOL (ROBAXIN) 500 MG TABLET    Take 1 tablet (500 mg total) by mouth 2 (two) times daily.   NAPROXEN (NAPROSYN) 500 MG TABLET    Take 1 tablet (500 mg total) by mouth 2 (two) times daily.     Satira Sarkicole Elizabeth San MarcosNadeau, [place] JerseyPA-C 06/26/16 2004    Canary Brimhristopher J Tegeler, MD 06/26/16 [phone]

## 2016-06-26 NOTE — Discharge Instructions (Signed)
Take your medications as prescribed. You may also apply ice and/or heat to affected area for 15-20 minutes 3-4 times daily. Refrain from doing any heavy lifting or repetitive movements of exacerbating her symptoms for the next few days. Follow-up with your primary care provider in the next week if your symptoms have not improved. Please return to the Emergency Department if symptoms worsen or new onset of fever, numbness, tingling, groin anesthesia, loss of bowel or bladder, weakness, abdominal pain, vomiting.

## 2016-06-26 NOTE — ED Triage Notes (Signed)
Pt states she has mid back pain for the week. Pt states the pain is worse with laying or sitting. Denies any abd pain or urinary symptoms.

## 2016-10-02 ENCOUNTER — Encounter (HOSPITAL_COMMUNITY): Payer: Self-pay

## 2016-10-02 ENCOUNTER — Emergency Department (HOSPITAL_COMMUNITY)
Admission: EM | Admit: 2016-10-02 | Discharge: 2016-10-02 | Disposition: A | Discharge status: Home / Self Care | Payer: Medicaid Other | Attending: Emergency Medicine | Admitting: Emergency Medicine

## 2016-10-02 DIAGNOSIS — Y998 Other external cause status: Secondary | ICD-10-CM | POA: Insufficient documentation

## 2016-10-02 DIAGNOSIS — Y33XXXA Other specified events, undetermined intent, initial encounter: Secondary | ICD-10-CM | POA: Insufficient documentation

## 2016-10-02 DIAGNOSIS — Y929 Unspecified place or not applicable: Secondary | ICD-10-CM | POA: Insufficient documentation

## 2016-10-02 DIAGNOSIS — S93401A Sprain of unspecified ligament of right ankle, initial encounter: Secondary | ICD-10-CM

## 2016-10-02 DIAGNOSIS — Y939 Activity, unspecified: Secondary | ICD-10-CM | POA: Insufficient documentation

## 2016-10-02 DIAGNOSIS — Z79899 Other long term (current) drug therapy: Secondary | ICD-10-CM | POA: Insufficient documentation

## 2016-10-02 DIAGNOSIS — F17210 Nicotine dependence, cigarettes, uncomplicated: Secondary | ICD-10-CM | POA: Insufficient documentation

## 2016-10-02 MED ORDER — CYCLOBENZAPRINE HCL 10 MG PO TABS: 10.0000 mg | ORAL_TABLET | Freq: Two times a day (BID) | ORAL | 0 refills | Status: DC | PRN

## 2016-10-02 MED ORDER — IBUPROFEN 600 MG PO TABS: 600.0000 mg | ORAL_TABLET | Freq: Four times a day (QID) | ORAL | 0 refills | Status: DC | PRN

## 2016-10-02 NOTE — ED Provider Notes (Signed)
[facility] Provider Note   CSN: [id number] Arrival date & time: 10/02/16  4098  By signing my name below, I, Cynda Acres, attest that this documentation has been prepared under the direction and in the presence of Fayrene Helper, PA-C. Electronically Signed: Cynda Acres, Scribe. 10/02/16. 11:32 AM.  History   Chief Complaint Chief Complaint  Patient presents with  . Ankle Pain    HPI Comments: Megan Parks is a 36 y.o. female with no pertinent past meidcal history, who presents to the Emergency Department complaining of sudden-onset, constant right ankle pain that began two days ago. Patient states she stepped wrong and jumped in order to avoid falling two days ago. Patient states she has had right ankle pain that is only exacerbated by ambulation. Patient describes her pain as 7/10 sharp, moderate. Patient states her pain is improved with rest, she has not tried any medications for this problem. Patient is able to ambulate with pain. Patient denies any fever, chills, numbness, weakness, or any additional symptoms.   The history is provided by the patient. No language interpreter was used.    Past Medical History:  Diagnosis Date  . Anemia 06/29/2013  . Dysmenorrhea     Patient Active Problem List   Diagnosis Date Noted  . 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 Menorrhagia with irregular cycle 06/29/2013  . Smoker 06/29/2013  . Anemia 06/29/2013  . Obesity (BMI 30-39.9) 06/29/2013    Past Surgical History:  Procedure Laterality Date  . CESAREAN SECTION  2001  . CESAREAN SECTION WITH BILATERAL TUBAL LIGATION  2002    OB History    Gravida Para Term Preterm AB Living   2 2 2     2    SAB TAB Ectopic Multiple Live Births           2       Home Medications    Prior to Admission medications   Medication Sig Start Date End Date Taking? Authorizing Provider  cyclobenzaprine (FLEXERIL) 10 MG tablet Take 1 tablet (10 mg total) by mouth 2 (two) times daily as  needed for muscle spasms. 10/15/13   Brandt Loosen, MD  diphenhydrAMINE (BENADRYL) 25 MG tablet Take 1 tablet (25 mg total) by mouth every 6 (six) hours. 05/14/15   Joy, Shawn C, PA-C  hydrOXYzine (ATARAX/VISTARIL) 25 MG tablet Take 1 tablet (25 mg total) by mouth every 6 (six) hours. 05/18/15   [name], MD  ibuprofen (ADVIL,MOTRIN) 600 MG tablet Take 1 tablet (600 mg total) by mouth every 6 (six) hours as needed. 04/07/14   Kirichenko, Lemont Fillers, PA-C  methocarbamol (ROBAXIN) 500 MG tablet Take 1 tablet (500 mg total) by mouth 2 (two) times daily. 06/26/16   Barrett Henle, PA-C  naproxen (NAPROSYN) 500 MG tablet Take 1 tablet (500 mg total) by mouth 2 (two) times daily. 06/26/16   Barrett Henle, PA-C  neomycin-polymyxin-hydrocortisone (CORTISPORIN) otic solution Apply to affected areas on toes twice daily 12/27/14   Hyatt, Max T, DPM  permethrin (ELIMITE) 5 % cream Apply to affected area once 05/18/15   [name], MD  predniSONE (DELTASONE) 10 MG tablet Take 1 tablet (10 mg total) by mouth 2 (two) times daily with a meal. 05/14/15   Joy, Shawn C, PA-C  terbinafine (LAMISIL) 250 MG tablet Take 1 tablet (250 mg total) by mouth daily. 02/14/15   Hyatt, Max T, DPM  traMADol (ULTRAM) 50 MG tablet Take 1 tablet (50 mg total) by mouth every 6 (six) hours as  needed. 10/15/13   Brandt Loosen, MD    Family History Family History  Problem Relation Age of Onset  . Diabetes Mother   . Breast cancer Paternal Aunt   . Diabetes Maternal Grandmother   . Hypertension Maternal Grandmother     Social History Social History  Substance Use Topics  . Smoking status: Current Every Day Smoker    Packs/day: 0.50    Years: 17.00    Types: Cigarettes  . Smokeless tobacco: Never Used     Comment: One pack every two days  . Alcohol use No     Allergies   Patient has no known allergies.   Review of Systems Review of Systems  Constitutional: Negative for chills and fever.  Musculoskeletal:  Positive for arthralgias (right ankle). Negative for gait problem.  Neurological: Negative for weakness and numbness.     Physical Exam Updated Vital Signs BP (!) 130/92 (BP Location: Left Arm)   Pulse 86   Temp 98.1 F (36.7 C) (Oral)   Resp 16   Ht 5\' 4"  (1.626 m)   Wt 220 lb (99.8 kg)   LMP 09/25/2016 (Within Days)   SpO2 99%   BMI 37.76 kg/m   Physical Exam  Constitutional: She is oriented to person, place, and time. She appears well-developed and well-nourished.  HENT:  Head: Normocephalic and atraumatic.  Eyes: EOM are normal. Pupils are equal, round, and reactive to light.  Neck: Normal range of motion. Neck supple.  Cardiovascular: Normal rate and regular rhythm.   Pulmonary/Chest: Effort normal and breath sounds normal.  Musculoskeletal: Normal range of motion. She exhibits tenderness. She exhibits no edema or deformity.  Tenderness noted to the posterior right ankle on palpation without any significant swelling or deformity. Normal dorsi and plantar flexion. No joint swelling. DP pulse is palpable. Capillary refill is normal.   Neurological: She is alert and oriented to person, place, and time.  Skin: Skin is warm and dry.  Psychiatric: She has a normal mood and affect.  Nursing note and vitals reviewed.    ED Treatments / Results  DIAGNOSTIC STUDIES: Oxygen Saturation is 99% on RA, normal by my interpretation.    COORDINATION OF CARE: 11:31 AM Discussed treatment plan with pt at bedside and pt agreed to plan, which includes ibuprofen.   Labs (all labs ordered are listed, but only abnormal results are displayed) Labs Reviewed - No data to display  EKG  EKG Interpretation None       Radiology No results found.  Procedures Procedures (including critical care time)  Medications Ordered in ED Medications - No data to display   Initial Impression / Assessment and Plan / ED Course  I have reviewed the triage vital signs and the nursing  notes.  Pertinent labs & imaging results that were available during my care of the patient were reviewed by me and considered in my medical decision making (see chart for details).     Suspect mild sprain, doubt fx.. Pt advised to follow up with orthopedics if symptoms persist for possibility of missed fracture diagnosis. Patient given brace while in ED, conservative therapy recommended and discussed. Patient will be dc home & is agreeable with above plan.  BP (!) 130/92 (BP Location: Left Arm)   Pulse 86   Temp 98.1 F (36.7 C) (Oral)   Resp 16   Ht 5\' 4"  (1.626 m)   Wt 99.8 kg (220 lb)   LMP 09/25/2016 (Within Days)   SpO2 99%  BMI 37.76 kg/m    Final Clinical Impressions(s) / ED Diagnoses   Final diagnoses:  Sprain of right ankle, unspecified ligament, initial encounter    New Prescriptions Current Discharge Medication List     I personally performed the services described in this documentation, which was scribed in my presence. The recorded information has been reviewed and is accurate.       Fayrene Helperran, Kimo Bancroft, PA-C 10/02/16 1134    Mancel BaleWentz, Elliott, MD 10/02/16 [phone]

## 2016-10-02 NOTE — ED Triage Notes (Addendum)
Pt states she has had pain in her right ankle when standing X1 day. She reports the pain above the heel of her foot. Denies injury. No swelling noted. Pedal pulse intact.

## 2017-02-28 ENCOUNTER — Encounter (HOSPITAL_COMMUNITY): Payer: Self-pay

## 2017-02-28 ENCOUNTER — Other Ambulatory Visit: Payer: Self-pay

## 2017-02-28 ENCOUNTER — Emergency Department (HOSPITAL_COMMUNITY)
Admission: EM | Admit: 2017-02-28 | Discharge: 2017-02-28 | Disposition: A | Discharge status: Home / Self Care | Payer: Self-pay | Attending: Emergency Medicine | Admitting: Emergency Medicine

## 2017-02-28 DIAGNOSIS — Z79899 Other long term (current) drug therapy: Secondary | ICD-10-CM | POA: Insufficient documentation

## 2017-02-28 DIAGNOSIS — D649 Anemia, unspecified: Secondary | ICD-10-CM | POA: Insufficient documentation

## 2017-02-28 DIAGNOSIS — Z791 Long term (current) use of non-steroidal anti-inflammatories (NSAID): Secondary | ICD-10-CM | POA: Insufficient documentation

## 2017-02-28 DIAGNOSIS — K047 Periapical abscess without sinus: Secondary | ICD-10-CM | POA: Insufficient documentation

## 2017-02-28 DIAGNOSIS — F17210 Nicotine dependence, cigarettes, uncomplicated: Secondary | ICD-10-CM | POA: Insufficient documentation

## 2017-02-28 MED ORDER — BUPIVACAINE HCL (PF) 0.5 % IJ SOLN
10.0000 mL | Freq: Once | INTRAMUSCULAR | Status: AC
  Administered 2017-02-28: 10 mL
  Filled 2017-02-28: qty 10

## 2017-02-28 MED ORDER — CLINDAMYCIN HCL 150 MG PO CAPS
150.0000 mg | ORAL_CAPSULE | Freq: Once | ORAL | Status: AC
  Administered 2017-02-28: 150 mg via ORAL
  Filled 2017-02-28: qty 1

## 2017-02-28 MED ORDER — IBUPROFEN 400 MG PO TABS
800.0000 mg | ORAL_TABLET | Freq: Once | ORAL | Status: AC
  Administered 2017-02-28: 800 mg via ORAL
  Filled 2017-02-28: qty 2

## 2017-02-28 MED ORDER — CLINDAMYCIN HCL 150 MG PO CAPS: 150.0000 mg | ORAL_CAPSULE | Freq: Four times a day (QID) | ORAL | 0 refills | Status: DC

## 2017-02-28 NOTE — ED Provider Notes (Signed)
[facility]   CSN: [id number] Arrival date & time: 02/28/17  1107     History   Chief Complaint Chief Complaint  Patient presents with  . Dental Pain    HPI Megan Parks is a 36 y.o. female.  Patient has left upper dental pain that began last night. Patient had a fractured tooth almost 2 years.Patient does not have insurance and no dentist. No fevers chills or vomiting. Pain with eating.      Past Medical History:  Diagnosis Date  . Anemia 06/29/2013  . Dysmenorrhea     Patient Active Problem List   Diagnosis Date Noted  . 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 Menorrhagia with irregular cycle 06/29/2013  . Smoker 06/29/2013  . Anemia 06/29/2013  . Obesity (BMI 30-39.9) 06/29/2013    Past Surgical History:  Procedure Laterality Date  . CESAREAN SECTION  2001  . CESAREAN SECTION WITH BILATERAL TUBAL LIGATION  2002    OB History    Gravida Para Term Preterm AB Living   2 2 2     2    SAB TAB Ectopic Multiple Live Births           2       Home Medications    Prior to Admission medications   Medication Sig Start Date End Date Taking? Authorizing Provider  clindamycin (CLEOCIN) 150 MG capsule Take 1 capsule (150 mg total) every 6 (six) hours by mouth. 02/28/17   Blane OharaZavitz, Amberlee Garvey, MD  cyclobenzaprine (FLEXERIL) 10 MG tablet Take 1 tablet (10 mg total) by mouth 2 (two) times daily as needed for muscle spasms. 10/02/16   Fayrene Helperran, Bowie, PA-C  diphenhydrAMINE (BENADRYL) 25 MG tablet Take 1 tablet (25 mg total) by mouth every 6 (six) hours. 05/14/15   Joy, Shawn C, PA-C  hydrOXYzine (ATARAX/VISTARIL) 25 MG tablet Take 1 tablet (25 mg total) by mouth every 6 (six) hours. 05/18/15   Geoffery Lyonselo, Douglas, MD  ibuprofen (ADVIL,MOTRIN) 600 MG tablet Take 1 tablet (600 mg total) by mouth every 6 (six) hours as needed for moderate pain. 10/02/16   Fayrene Helperran, Bowie, PA-C  methocarbamol (ROBAXIN) 500 MG tablet Take 1 tablet (500 mg total)  by mouth 2 (two) times daily. 06/26/16   Barrett HenleNadeau, Nicole Elizabeth, PA-C  naproxen (NAPROSYN) 500 MG tablet Take 1 tablet (500 mg total) by mouth 2 (two) times daily. 06/26/16   Barrett HenleNadeau, Nicole Elizabeth, PA-C  neomycin-polymyxin-hydrocortisone (CORTISPORIN) otic solution Apply to affected areas on toes twice daily 12/27/14   Hyatt, Max T, DPM  permethrin (ELIMITE) 5 % cream Apply to affected area once 05/18/15   Geoffery Lyonselo, Douglas, MD  predniSONE (DELTASONE) 10 MG tablet Take 1 tablet (10 mg total) by mouth 2 (two) times daily with a meal. 05/14/15   Joy, Shawn C, PA-C  terbinafine (LAMISIL) 250 MG tablet Take 1 tablet (250 mg total) by mouth daily. 02/14/15   Hyatt, Max T, DPM  traMADol (ULTRAM) 50 MG tablet Take 1 tablet (50 mg total) by mouth every 6 (six) hours as needed. 10/15/13   Brandt LoosenManly, Julie, MD    Family History Family History  Problem Relation Age of Onset  . Diabetes Mother   . Breast cancer Paternal Aunt   . Diabetes Maternal Grandmother   . Hypertension Maternal Grandmother     Social History Social History   Tobacco Use  . Smoking status: Current Every Day Smoker    Packs/day: 0.50    Years: 17.00  Pack years: 8.50    Types: Cigarettes  . Smokeless tobacco: Never Used  . Tobacco comment: One pack every two days  Substance Use Topics  . Alcohol use: No  . Drug use: No     Allergies   Patient has no known allergies.   Review of Systems Review of Systems  Constitutional: Negative for fever.  HENT: Positive for dental problem.      Physical Exam Updated Vital Signs BP 124/85 (BP Location: Right Arm)   Pulse 95   Temp 98.2 F (36.8 C) (Oral)   Resp 18   Ht 5\' 4"  (1.626 m)   Wt 99.8 kg (220 lb)   LMP 02/21/2017 (Exact Date)   SpO2 98%   BMI 37.76 kg/m   Physical Exam  Constitutional: She appears well-developed and well-nourished.  HENT:  Head: Normocephalic.  Patient is focal swelling left upper gingiva mild tender to palpation. No trismus. No submandibular  edema.  Neck: Normal range of motion. Neck supple.  Cardiovascular: Normal rate.  Pulmonary/Chest: Effort normal.  Nursing note and vitals reviewed.    ED Treatments / Results  Labs (all labs ordered are listed, but only abnormal results are displayed) Labs Reviewed - No data to display  EKG  EKG Interpretation None       Radiology No results found.  Procedures .Marland KitchenIncision and Drainage Date/Time: 02/28/2017 3:09 PM Performed by: Blane Ohara, MD Authorized by: Blane Ohara, MD   Consent:    Consent obtained:  Verbal   Consent given by:  Patient   Risks discussed:  Bleeding, incomplete drainage, pain, damage to other organs and infection   Alternatives discussed:  No treatment Location:    Type:  Abscess   Location:  Mouth   Mouth location:  Alveolar process Anesthesia (see MAR for exact dosages):    Anesthesia method:  Local infiltration and nerve block   Local anesthetic:  Bupivacaine 0.5% w/o epi   Block location:  Left upper molar/mid superior alveolar   Block needle gauge:  27 G   Block anesthetic:  Bupivacaine 0.5% w/o epi Procedure type:    Complexity:  Simple Procedure details:    Needle aspiration: yes     Needle size:  25 G   Incision types:  Single straight   Incision depth:  Submucosal   Scalpel blade:  11   Wound management:  Irrigated with saline   Drainage:  Bloody and purulent   Drainage amount:  Moderate   Wound treatment:  Wound left open   Packing materials:  None Post-procedure details:    Patient tolerance of procedure:  Tolerated well, no immediate complications   (including critical care time)  Medications Ordered in ED Medications  bupivacaine (MARCAINE) 0.5 % injection 10 mL (not administered)  ibuprofen (ADVIL,MOTRIN) tablet 800 mg (800 mg Oral Given 02/28/17 1402)  clindamycin (CLEOCIN) capsule 150 mg (150 mg Oral Given 02/28/17 1402)     Initial Impression / Assessment and Plan / ED Course  I have reviewed the triage  vital signs and the nursing notes.  Pertinent labs & imaging results that were available during my care of the patient were reviewed by me and considered in my medical decision making (see chart for details).    Patient with dental abscess. Drained at the bedside without significant difficulty. Plan for dental follow-up and oral antibiotics.  Results and differential diagnosis were discussed with the patient/parent/guardian. Xrays were independently reviewed by myself.  Close follow up outpatient was discussed, comfortable with  the plan.   Medications  bupivacaine (MARCAINE) 0.5 % injection 10 mL (not administered)  ibuprofen (ADVIL,MOTRIN) tablet 800 mg (800 mg Oral Given 02/28/17 1402)  clindamycin (CLEOCIN) capsule 150 mg (150 mg Oral Given 02/28/17 1402)    Vitals:   02/28/17 1135 02/28/17 1334  BP: 132/85 124/85  Pulse: 97 95  Resp: 16 18  Temp: 98.2 F (36.8 C)   TempSrc: Oral   SpO2: 97% 98%  Weight: 99.8 kg (220 lb)   Height: 5\' 4"  (1.626 m)     Final diagnoses:  Dental abscess     Final Clinical Impressions(s) / ED Diagnoses   Final diagnoses:  Dental abscess    ED Discharge Orders        Ordered    clindamycin (CLEOCIN) 150 MG capsule  Every 6 hours     02/28/17 1506       Blane OharaZavitz, Winson Eichorn, MD 02/28/17 1511

## 2017-02-28 NOTE — Discharge Instructions (Signed)
Please send home with dental resource guide. Take antibiotics as directed. Take ibuprofen and tylenol every 6 hrs for pain as needed. See dentist end of next week

## 2017-02-28 NOTE — ED Triage Notes (Signed)
Pt endorses left upper dental pain that began last night, pt has broken tooth x 2 years but it has never been a problem until yesterday. Does not have a dentist. VSS

## 2017-07-21 ENCOUNTER — Emergency Department (HOSPITAL_COMMUNITY)
Admission: EM | Admit: 2017-07-21 | Discharge: 2017-07-21 | Disposition: A | Discharge status: Home / Self Care | Payer: Self-pay | Attending: Emergency Medicine | Admitting: Emergency Medicine

## 2017-07-21 ENCOUNTER — Other Ambulatory Visit: Payer: Self-pay

## 2017-07-21 ENCOUNTER — Encounter (HOSPITAL_COMMUNITY): Payer: Self-pay | Admitting: *Deleted

## 2017-07-21 DIAGNOSIS — Z79899 Other long term (current) drug therapy: Secondary | ICD-10-CM | POA: Insufficient documentation

## 2017-07-21 DIAGNOSIS — F17210 Nicotine dependence, cigarettes, uncomplicated: Secondary | ICD-10-CM | POA: Insufficient documentation

## 2017-07-21 DIAGNOSIS — M546 Pain in thoracic spine: Secondary | ICD-10-CM | POA: Insufficient documentation

## 2017-07-21 MED ORDER — METHOCARBAMOL 500 MG PO TABS: 500.0000 mg | ORAL_TABLET | Freq: Two times a day (BID) | ORAL | 0 refills | Status: DC

## 2017-07-21 MED ORDER — CYCLOBENZAPRINE HCL 10 MG PO TABS: 10.0000 mg | ORAL_TABLET | Freq: Two times a day (BID) | ORAL | 0 refills | Status: DC | PRN

## 2017-07-21 NOTE — Discharge Instructions (Addendum)
Likely muscle strain. Take tylenol 1000 mg plus 600 mg ibuprofen every 6-8 hours, tramadol for more severe pain.   Return for chest pain, shortness of breath, cough, fevers.

## 2017-07-21 NOTE — ED Provider Notes (Signed)
[facility]   CSN: [id number] Arrival date & time: 07/21/17  1002     History   Chief Complaint Chief Complaint  Patient presents with  . Back Pain    HPI Megan Parks is a 37 y.o. female here for evaluation of left mid back pain described as pulling and tightness noticed this morning while at work. Pain is worse with movement, lifting, twisting her trunk and bending down. Was helping someone lift and move things around yesterday, felt ok afterwards but woke up feeling stiff. Has pulled muscles in the past and this feels similar. No fevers, chills, CP, SOB, cough, abdominal pain, urinary symptoms.   HPI  Past Medical History:  Diagnosis Date  . Anemia 06/29/2013  . Dysmenorrhea     Patient Active Problem List   Diagnosis Date Noted  . 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 Menorrhagia with irregular cycle 06/29/2013  . Smoker 06/29/2013  . Anemia 06/29/2013  . Obesity (BMI 30-39.9) 06/29/2013    Past Surgical History:  Procedure Laterality Date  . CESAREAN SECTION  2001  . CESAREAN SECTION WITH BILATERAL TUBAL LIGATION  2002     OB History    Gravida  2   Para  2   Term  2   Preterm      AB      Living  2     SAB      TAB      Ectopic      Multiple      Live Births  2            Home Medications    Prior to Admission medications   Medication Sig Start Date End Date Taking? Authorizing Provider  clindamycin (CLEOCIN) 150 MG capsule Take 1 capsule (150 mg total) every 6 (six) hours by mouth. 02/28/17   Blane OharaZavitz, Joshua, MD  cyclobenzaprine (FLEXERIL) 10 MG tablet Take 1 tablet (10 mg total) by mouth 2 (two) times daily as needed for muscle spasms. 07/21/17   Liberty HandyGibbons, Claudia J, PA-C  diphenhydrAMINE (BENADRYL) 25 MG tablet Take 1 tablet (25 mg total) by mouth every 6 (six) hours. 05/14/15   Joy, Shawn C, PA-C  hydrOXYzine (ATARAX/VISTARIL) 25 MG tablet Take 1 tablet (25 mg total) by mouth  every 6 (six) hours. 05/18/15   Geoffery Lyonselo, Douglas, MD  ibuprofen (ADVIL,MOTRIN) 600 MG tablet Take 1 tablet (600 mg total) by mouth every 6 (six) hours as needed for moderate pain. 10/02/16   Fayrene Helperran, Bowie, PA-C  methocarbamol (ROBAXIN) 500 MG tablet Take 1 tablet (500 mg total) by mouth 2 (two) times daily. 07/21/17   Liberty HandyGibbons, Claudia J, PA-C  naproxen (NAPROSYN) 500 MG tablet Take 1 tablet (500 mg total) by mouth 2 (two) times daily. 06/26/16   Barrett HenleNadeau, Nicole Elizabeth, PA-C  neomycin-polymyxin-hydrocortisone (CORTISPORIN) otic solution Apply to affected areas on toes twice daily 12/27/14   Hyatt, Max T, DPM  permethrin (ELIMITE) 5 % cream Apply to affected area once 05/18/15   Geoffery Lyonselo, Douglas, MD  predniSONE (DELTASONE) 10 MG tablet Take 1 tablet (10 mg total) by mouth 2 (two) times daily with a meal. 05/14/15   Joy, Shawn C, PA-C  terbinafine (LAMISIL) 250 MG tablet Take 1 tablet (250 mg total) by mouth daily. 02/14/15   Hyatt, Max T, DPM  traMADol (ULTRAM) 50 MG tablet Take 1 tablet (50 mg total) by mouth every 6 (six) hours as needed. 10/15/13   Brandt LoosenManly, Julie, MD  Family History Family History  Problem Relation Age of Onset  . Diabetes Mother   . Breast cancer Paternal Aunt   . Diabetes Maternal Grandmother   . Hypertension Maternal Grandmother     Social History Social History   Tobacco Use  . Smoking status: Current Every Day Smoker    Packs/day: 0.50    Years: 17.00    Pack years: 8.50    Types: Cigarettes  . Smokeless tobacco: Never Used  . Tobacco comment: One pack every two days  Substance Use Topics  . Alcohol use: No  . Drug use: No     Allergies   Patient has no known allergies.   Review of Systems Review of Systems  Musculoskeletal: Positive for back pain.  All other systems reviewed and are negative.    Physical Exam Updated Vital Signs BP (!) 151/92 (BP Location: Right Arm)   Pulse 72   Temp 99.3 F (37.4 C) (Oral)   Resp 18   LMP 07/17/2017   SpO2 100%    Physical Exam  Constitutional: She is oriented to person, place, and time. She appears well-developed and well-nourished. No distress.  Non toxic  HENT:  Head: Normocephalic and atraumatic.  Nose: Nose normal.  Mouth/Throat: No oropharyngeal exudate.  Eyes: Pupils are equal, round, and reactive to light. Conjunctivae and EOM are normal.  Neck: Normal range of motion.  Cardiovascular: Normal rate, regular rhythm and intact distal pulses.  No murmur heard. 2+ DP and radial pulses bilaterally. No LE edema.   Pulmonary/Chest: Effort normal and breath sounds normal.  Abdominal: Soft. Bowel sounds are normal. There is no tenderness.  No G/R/R. No suprapubic or CVA tenderness.   Musculoskeletal: Normal range of motion. She exhibits tenderness. She exhibits no deformity.  Diffuse left thoracic muscular tenderness, no midline CTL spine tenderness of step offs. Pain exacerbated with AROM of upper extremities and trunk movements.   Neurological: She is alert and oriented to person, place, and time.  5/5 hand grip, flexion F/E, shoulder abd/add bilaterally Sensation to light touch in M/U/R nerve distribution intact bilaterally  Skin: Skin is warm and dry. Capillary refill takes less than 2 seconds.  No overlaying rash to back  Psychiatric: She has a normal mood and affect. Her behavior is normal. Judgment and thought content normal.  Nursing note and vitals reviewed.    ED Treatments / Results  Labs (all labs ordered are listed, but only abnormal results are displayed) Labs Reviewed - No data to display  EKG None  Radiology No results found.  Procedures Procedures (including critical care time)  Medications Ordered in ED Medications - No data to display   Initial Impression / Assessment and Plan / ED Course  I have reviewed the triage vital signs and the nursing notes.  Pertinent labs & imaging results that were available during my care of the patient were reviewed by me and  considered in my medical decision making (see chart for details).     37 year old here with left midthoracic back pain since yesterday, unknown over exertional activity. Symptoms today are reproducible with palpation and movement, most likely musculoskeletal. She has no midline spinal tenderness, overlying rash, unilateral neurologic deficits. Affected area is above CVA, no suprapubic, CVA tenderness, urinary symptoms. She has no cough, fevers, chills, pleuritic chest pain there is no hypoxia, tachypnea, SOB unlikely PE. We'll discharge with conservative management. Discussed return precautions.  Final Clinical Impressions(s) / ED Diagnoses   Final diagnoses:  Acute right-sided thoracic  back pain    ED Discharge Orders        Ordered    cyclobenzaprine (FLEXERIL) 10 MG tablet  2 times daily PRN     07/21/17 1307    methocarbamol (ROBAXIN) 500 MG tablet  2 times daily     07/21/17 1307       Liberty Handy, PA-C 07/21/17 1312    Melene Plan, DO 07/21/17 1523

## 2017-07-21 NOTE — ED Triage Notes (Signed)
Pt reports heavy lifting yesterday and onset of left side back pain last night. Pain increases with movement. Ambulatory at triage.

## 2017-11-30 ENCOUNTER — Emergency Department (HOSPITAL_COMMUNITY)
Admission: EM | Admit: 2017-11-30 | Discharge: 2017-11-30 | Disposition: A | Discharge status: Home / Self Care | Payer: Self-pay | Attending: Emergency Medicine | Admitting: Emergency Medicine

## 2017-11-30 ENCOUNTER — Other Ambulatory Visit: Payer: Self-pay

## 2017-11-30 ENCOUNTER — Encounter (HOSPITAL_COMMUNITY): Payer: Self-pay | Admitting: Emergency Medicine

## 2017-11-30 ENCOUNTER — Emergency Department (HOSPITAL_COMMUNITY): Payer: Self-pay

## 2017-11-30 DIAGNOSIS — R0602 Shortness of breath: Secondary | ICD-10-CM | POA: Insufficient documentation

## 2017-11-30 DIAGNOSIS — Z79899 Other long term (current) drug therapy: Secondary | ICD-10-CM | POA: Insufficient documentation

## 2017-11-30 DIAGNOSIS — F17210 Nicotine dependence, cigarettes, uncomplicated: Secondary | ICD-10-CM | POA: Insufficient documentation

## 2017-11-30 MED ORDER — ALBUTEROL SULFATE HFA 108 (90 BASE) MCG/ACT IN AERS
2.0000 | INHALATION_SPRAY | Freq: Once | RESPIRATORY_TRACT | Status: AC
  Administered 2017-11-30: 2 via RESPIRATORY_TRACT
  Filled 2017-11-30: qty 6.7

## 2017-11-30 NOTE — ED Provider Notes (Signed)
[facility] Provider Note   CSN: [id number] Arrival date & time: 11/30/17  0043     History   Chief Complaint Chief Complaint  Patient presents with  . Shortness of Breath    HPI Megan Parks is a 37 y.o. female.  37 yo F with a chief complaint of shortness of breath.  Going on for the past for 5 days.  She thought was due to smoking.  Seem to happen when she was actively smoking.  She tried to quit and has not had any improvement.  She denies cough congestion fevers or chills.  Denies chest pain.  Nonexertional.  The history is provided by the patient.  Shortness of Breath  This is a new problem. The average episode lasts 4 days. The problem occurs continuously.The current episode started more than 2 days ago. The problem has been gradually worsening. Pertinent negatives include no fever, no headaches, no rhinorrhea, no wheezing, no chest pain and no vomiting. She has tried nothing for the symptoms. The treatment provided no relief. She has had no prior hospitalizations. Associated medical issues do not include asthma or COPD.    Past Medical History:  Diagnosis Date  . Anemia 06/29/2013  . Dysmenorrhea     Patient Active Problem List   Diagnosis Date Noted  . 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 Menorrhagia with irregular cycle 06/29/2013  . Smoker 06/29/2013  . Anemia 06/29/2013  . Obesity (BMI 30-39.9) 06/29/2013    Past Surgical History:  Procedure Laterality Date  . CESAREAN SECTION  2001  . CESAREAN SECTION WITH BILATERAL TUBAL LIGATION  2002     OB History    Gravida  2   Para  2   Term  2   Preterm      AB      Living  2     SAB      TAB      Ectopic      Multiple      Live Births  2            Home Medications    Prior to Admission medications   Medication Sig Start Date End Date Taking? Authorizing Provider  clindamycin (CLEOCIN) 150 MG capsule Take 1 capsule (150 mg total) every 6  (six) hours by mouth. 02/28/17   Blane OharaZavitz, Joshua, MD  cyclobenzaprine (FLEXERIL) 10 MG tablet Take 1 tablet (10 mg total) by mouth 2 (two) times daily as needed for muscle spasms. 07/21/17   Liberty HandyGibbons, Claudia J, PA-C  diphenhydrAMINE (BENADRYL) 25 MG tablet Take 1 tablet (25 mg total) by mouth every 6 (six) hours. 05/14/15   Joy, Shawn C, PA-C  hydrOXYzine (ATARAX/VISTARIL) 25 MG tablet Take 1 tablet (25 mg total) by mouth every 6 (six) hours. 05/18/15   Geoffery Lyonselo, Douglas, MD  ibuprofen (ADVIL,MOTRIN) 600 MG tablet Take 1 tablet (600 mg total) by mouth every 6 (six) hours as needed for moderate pain. 10/02/16   Fayrene Helperran, Bowie, PA-C  methocarbamol (ROBAXIN) 500 MG tablet Take 1 tablet (500 mg total) by mouth 2 (two) times daily. 07/21/17   Liberty HandyGibbons, Claudia J, PA-C  naproxen (NAPROSYN) 500 MG tablet Take 1 tablet (500 mg total) by mouth 2 (two) times daily. 06/26/16   Barrett HenleNadeau, Nicole Elizabeth, PA-C  neomycin-polymyxin-hydrocortisone (CORTISPORIN) otic solution Apply to affected areas on toes twice daily 12/27/14   Hyatt, Max T, DPM  permethrin (ELIMITE) 5 % cream Apply to affected area once 05/18/15   Delo,  Riley Lamouglas, MD  predniSONE (DELTASONE) 10 MG tablet Take 1 tablet (10 mg total) by mouth 2 (two) times daily with a meal. 05/14/15   Joy, Shawn C, PA-C  terbinafine (LAMISIL) 250 MG tablet Take 1 tablet (250 mg total) by mouth daily. 02/14/15   Hyatt, Max T, DPM  traMADol (ULTRAM) 50 MG tablet Take 1 tablet (50 mg total) by mouth every 6 (six) hours as needed. 10/15/13   Brandt LoosenManly, Julie, MD    Family History Family History  Problem Relation Age of Onset  . Diabetes Mother   . Breast cancer Paternal Aunt   . Diabetes Maternal Grandmother   . Hypertension Maternal Grandmother     Social History Social History   Tobacco Use  . Smoking status: Current Every Day Smoker    Packs/day: 0.50    Years: 17.00    Pack years: 8.50    Types: Cigarettes  . Smokeless tobacco: Never Used  . Tobacco comment: One pack every two  days  Substance Use Topics  . Alcohol use: No  . Drug use: No     Allergies   Patient has no known allergies.   Review of Systems Review of Systems  Constitutional: Negative for chills and fever.  HENT: Negative for congestion and rhinorrhea.   Eyes: Negative for redness and visual disturbance.  Respiratory: Positive for shortness of breath. Negative for wheezing.   Cardiovascular: Negative for chest pain and palpitations.  Gastrointestinal: Negative for nausea and vomiting.  Genitourinary: Negative for dysuria and urgency.  Musculoskeletal: Negative for arthralgias and myalgias.  Skin: Negative for pallor and wound.  Neurological: Negative for dizziness and headaches.     Physical Exam Updated Vital Signs BP 108/68   Pulse 69   Resp 19   Ht 5\' 4"  (1.626 m)   Wt 99.8 kg   SpO2 98%   BMI 37.76 kg/m   Physical Exam  Constitutional: She is oriented to person, place, and time. She appears well-developed and well-nourished. No distress.  HENT:  Head: Normocephalic and atraumatic.  Eyes: Pupils are equal, round, and reactive to light. EOM are normal.  Neck: Normal range of motion. Neck supple.  Cardiovascular: Normal rate and regular rhythm. Exam reveals no gallop and no friction rub.  No murmur heard. Pulmonary/Chest: Effort normal. She has no wheezes. She has no rales.  Slightly diminished in all fields  Abdominal: Soft. She exhibits no distension. There is no tenderness.  Musculoskeletal: She exhibits no edema or tenderness.  Neurological: She is alert and oriented to person, place, and time.  Skin: Skin is warm and dry. She is not diaphoretic.  Psychiatric: She has a normal mood and affect. Her behavior is normal.  Nursing note and vitals reviewed.    ED Treatments / Results  Labs (all labs ordered are listed, but only abnormal results are displayed) Labs Reviewed - No data to display  EKG EKG Interpretation  Date/Time:  Sunday November 30 2017 01:01:20  EDT Ventricular Rate:  82 PR Interval:  180 QRS Duration: 74 QT Interval:  384 QTC Calculation: 448 R Axis:   36 Text Interpretation:  Normal sinus rhythm Normal ECG No significant change since last tracing Confirmed by Melene PlanFloyd, Jalicia Roszak [phone]([id number]) on 11/30/2017 4:35:11 AM   Radiology Dg Chest 2 View  Result Date: 11/30/2017 CLINICAL DATA:  Shortness of breath EXAM: CHEST - 2 VIEW COMPARISON:  Chest radiograph 10/15/2013 FINDINGS: Stable cardiac and mediastinal contours. No consolidative pulmonary opacities. No pleural effusion or pneumothorax. Regional skeleton is  unremarkable. IMPRESSION: No acute cardiopulmonary process. Electronically Signed   By: Annia Belt M.D.   On: 11/30/2017 01:23    Procedures Procedures (including critical care time)  Medications Ordered in ED Medications  albuterol (PROVENTIL HFA;VENTOLIN HFA) 108 (90 Base) MCG/ACT inhaler 2 puff (2 puffs Inhalation Given 11/30/17 0519)     Initial Impression / Assessment and Plan / ED Course  I have reviewed the triage vital signs and the nursing notes.  Pertinent labs & imaging results that were available during my care of the patient were reviewed by me and considered in my medical decision making (see chart for details).     37 yo F with a chief complaint of shortness of breath.  Chest x-ray reviewed by me negative for acute infiltrate or pneumothorax.  EKG unremarkable.  Patient's exam is also benign.  It is hard to hear her lung sounds due to her body habitus, I question whether there was prolonged expiration and so she is giving a trial of albuterol.  She had no improvement.  Discharge home.  6:18 AM:  I have discussed the diagnosis/risks/treatment options with the patient and believe the pt to be eligible for discharge home to follow-up with PCP. We also discussed returning to the ED immediately if new or worsening sx occur. We discussed the sx which are most concerning (e.g., sudden worsening pain, fever, inability to  tolerate by mouth) that necessitate immediate return. Medications administered to the patient during their visit and any new prescriptions provided to the patient are listed below.  Medications given during this visit Medications  albuterol (PROVENTIL HFA;VENTOLIN HFA) 108 (90 Base) MCG/ACT inhaler 2 puff (2 puffs Inhalation Given 11/30/17 0519)     The patient appears reasonably screen and/or stabilized for discharge and I doubt any other medical condition or other [facility] requiring further screening, evaluation, or treatment in the ED at this time prior to discharge.    Final Clinical Impressions(s) / ED Diagnoses   Final diagnoses:  Shortness of breath    ED Discharge Orders    None       Melene Plan, DO 11/30/17 [phone]

## 2017-11-30 NOTE — Discharge Instructions (Signed)
Follow-up with your PCP.

## 2017-11-30 NOTE — ED Triage Notes (Signed)
Per pt she has been having SOB X 3 DAYS. Pt stated she stopped smoking aND still feels like she can't breath. No distress noted at this time. SATS AT 100%

## 2017-12-01 ENCOUNTER — Encounter (HOSPITAL_COMMUNITY): Payer: Self-pay | Admitting: Emergency Medicine

## 2017-12-01 ENCOUNTER — Emergency Department (HOSPITAL_COMMUNITY): Payer: Self-pay

## 2017-12-01 ENCOUNTER — Other Ambulatory Visit: Payer: Self-pay

## 2017-12-01 ENCOUNTER — Emergency Department (HOSPITAL_COMMUNITY)
Admission: EM | Admit: 2017-12-01 | Discharge: 2017-12-02 | Disposition: A | Discharge status: Home / Self Care | Payer: Self-pay | Attending: Emergency Medicine | Admitting: Emergency Medicine

## 2017-12-01 DIAGNOSIS — R0602 Shortness of breath: Secondary | ICD-10-CM | POA: Insufficient documentation

## 2017-12-01 DIAGNOSIS — D508 Other iron deficiency anemias: Secondary | ICD-10-CM | POA: Insufficient documentation

## 2017-12-01 DIAGNOSIS — Z79899 Other long term (current) drug therapy: Secondary | ICD-10-CM | POA: Insufficient documentation

## 2017-12-01 DIAGNOSIS — Z87891 Personal history of nicotine dependence: Secondary | ICD-10-CM | POA: Insufficient documentation

## 2017-12-01 LAB — CBC
HEMATOCRIT: 27.7 % — AB (ref 36.0–46.0)
Hemoglobin: 8.1 g/dL — ABNORMAL LOW (ref 12.0–15.0)
MCH: 23.4 pg — ABNORMAL LOW (ref 26.0–34.0)
MCHC: 29.2 g/dL — AB (ref 30.0–36.0)
MCV: 80.1 fL (ref 78.0–100.0)
Platelets: 378 10*3/uL (ref 150–400)
RBC: 3.46 MIL/uL — ABNORMAL LOW (ref 3.87–5.11)
RDW: 16.3 % — AB (ref 11.5–15.5)
WBC: 9.4 10*3/uL (ref 4.0–10.5)

## 2017-12-01 LAB — BASIC METABOLIC PANEL
Anion gap: 8 (ref 5–15)
BUN: 14 mg/dL (ref 6–20)
CALCIUM: 8.9 mg/dL (ref 8.9–10.3)
CO2: 26 mmol/L (ref 22–32)
CREATININE: 0.89 mg/dL (ref 0.44–1.00)
Chloride: 109 mmol/L (ref 98–111)
GFR calc Af Amer: >60 mL/min (ref >60)
GFR calc non Af Amer: >60 mL/min (ref >60)
GLUCOSE: 89 mg/dL (ref 70–99)
Potassium: 3.7 mmol/L (ref 3.5–5.1)
Sodium: 143 mmol/L (ref 135–145)

## 2017-12-01 LAB — I-STAT BETA HCG BLOOD, ED (MC, WL, AP ONLY)

## 2017-12-01 LAB — URINALYSIS, ROUTINE W REFLEX MICROSCOPIC
Bilirubin Urine: NEGATIVE
Glucose, UA: NEGATIVE mg/dL
Ketones, ur: NEGATIVE mg/dL
Leukocytes, UA: NEGATIVE
Nitrite: NEGATIVE
Protein, ur: NEGATIVE mg/dL
SPECIFIC GRAVITY, URINE: 1.027 (ref 1.005–1.030)
pH: 6 (ref 5.0–8.0)

## 2017-12-01 NOTE — ED Triage Notes (Signed)
Pt reports she is having shortness of breath x 1 week and feeling near syncopal. Pt reports she feels worse at night. Pt seen recently and given an inhaler. Pt reports no relief.

## 2017-12-02 MED ORDER — FERROUS GLUCONATE 324 (38 FE) MG PO TABS: 324.0000 mg | ORAL_TABLET | Freq: Two times a day (BID) | ORAL | 0 refills | Status: AC

## 2017-12-02 NOTE — ED Provider Notes (Signed)
[facility] Provider Note  CSN: [id number] Arrival date & time: 12/01/17 2124  Chief Complaint(s) Shortness of Breath  HPI Megan Parks is a 37 y.o. female with a history of iron deficiency anemia, long history of smoking who presents to the emergency department with 1 week of episodic shortness of breath.  She reports that his symptoms have become more frequent since onset.  States that her shortness of breath episodes last 30 minutes to 1 hour and resolved spontaneously with slow breathing.  She does report that her symptoms can occur while she is trying to go to sleep.  She is also noted increased fatigue.  Denies any recent fevers or infections.  No cough or congestion.  No overt chest pain.  She does report chest tightness during the episodes but the tightness resolves when the shortness of breath resolves.  She denies any rapid heart rates but does endorse pounding heartbeat during these episodes.  She denies any leg swelling.  No recent travel or surgeries.  No OCPs.  No recent immobility.  No prior history of DVT/PEs.  Patient reports that she quit smoking 3 days ago due to these episodes.  States that she is supposed to be on iron supplements but has not taken them in years.  HPI  Past Medical History Past Medical History:  Diagnosis Date  . Anemia 06/29/2013  . Dysmenorrhea    Patient Active Problem List   Diagnosis Date Noted  . 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 Menorrhagia with irregular cycle 06/29/2013  . Smoker 06/29/2013  . Anemia 06/29/2013  . Obesity (BMI 30-39.9) 06/29/2013   Home Medication(s) Prior to Admission medications   Medication Sig Start Date End Date Taking? Authorizing Provider  clindamycin (CLEOCIN) 150 MG capsule Take 1 capsule (150 mg total) every 6 (six) hours by mouth. 02/28/17   Blane OharaZavitz, Joshua, MD  cyclobenzaprine (FLEXERIL) 10 MG tablet Take 1 tablet (10 mg total) by mouth 2 (two) times daily as needed  for muscle spasms. 07/21/17   Liberty HandyGibbons, Claudia J, PA-C  diphenhydrAMINE (BENADRYL) 25 MG tablet Take 1 tablet (25 mg total) by mouth every 6 (six) hours. 05/14/15   Joy, Shawn C, PA-C  ferrous gluconate (FERGON) 324 MG tablet Take 1 tablet (324 mg total) by mouth 2 (two) times daily with a meal. 12/02/17 01/01/18  Tanetta Fuhriman, Amadeo GarnetPedro Eduardo, MD  hydrOXYzine (ATARAX/VISTARIL) 25 MG tablet Take 1 tablet (25 mg total) by mouth every 6 (six) hours. 05/18/15   Geoffery Lyonselo, Douglas, MD  ibuprofen (ADVIL,MOTRIN) 600 MG tablet Take 1 tablet (600 mg total) by mouth every 6 (six) hours as needed for moderate pain. 10/02/16   Fayrene Helperran, Bowie, PA-C  methocarbamol (ROBAXIN) 500 MG tablet Take 1 tablet (500 mg total) by mouth 2 (two) times daily. 07/21/17   Liberty HandyGibbons, Claudia J, PA-C  naproxen (NAPROSYN) 500 MG tablet Take 1 tablet (500 mg total) by mouth 2 (two) times daily. 06/26/16   Barrett HenleNadeau, Nicole Elizabeth, PA-C  neomycin-polymyxin-hydrocortisone (CORTISPORIN) otic solution Apply to affected areas on toes twice daily 12/27/14   Hyatt, Max T, DPM  permethrin (ELIMITE) 5 % cream Apply to affected area once 05/18/15   Geoffery Lyonselo, Douglas, MD  predniSONE (DELTASONE) 10 MG tablet Take 1 tablet (10 mg total) by mouth 2 (two) times daily with a meal. 05/14/15   Joy, Shawn C, PA-C  terbinafine (LAMISIL) 250 MG tablet Take 1 tablet (250 mg total) by mouth daily. 02/14/15   Hyatt, Max T, DPM  traMADol Janean Sark(ULTRAM)  50 MG tablet Take 1 tablet (50 mg total) by mouth every 6 (six) hours as needed. 10/15/13   Brandt Loosen, MD                                                                                                                                    Past Surgical History Past Surgical History:  Procedure Laterality Date  . CESAREAN SECTION  2001  . CESAREAN SECTION WITH BILATERAL TUBAL LIGATION  2002   Family History Family History  Problem Relation Age of Onset  . Diabetes Mother   . Breast cancer Paternal Aunt   . Diabetes Maternal Grandmother   .  Hypertension Maternal Grandmother     Social History Social History   Tobacco Use  . Smoking status: Former Smoker    Packs/day: 0.50    Years: 17.00    Pack years: 8.50    Types: Cigarettes    Last attempt to quit: 11/27/2017    Years since quitting: 0.0  . Smokeless tobacco: Never Used  . Tobacco comment: One pack every two days  Substance Use Topics  . Alcohol use: No  . Drug use: No   Allergies Patient has no known allergies.  Review of Systems Review of Systems All other systems are reviewed and are negative for acute change except as noted in the HPI  Physical Exam Vital Signs  I have reviewed the triage vital signs BP 133/71   Pulse 75   Temp 98 F (36.7 C)   Resp 16   Ht 5\' 4"  (1.626 m)   Wt 99 kg   LMP 11/22/2017   SpO2 98%   BMI 37.46 kg/m    Physical Exam  Constitutional: She is oriented to person, place, and time. She appears well-developed and well-nourished. No distress.  Overweight  HENT:  Head: Normocephalic and atraumatic.  Nose: Nose normal.  Eyes: Pupils are equal, round, and reactive to light. Conjunctivae and EOM are normal. Right eye exhibits no discharge. Left eye exhibits no discharge. No scleral icterus.  Neck: Normal range of motion. Neck supple.  Cardiovascular: Normal rate and regular rhythm. Exam reveals no gallop and no friction rub.  No murmur heard. Pulmonary/Chest: Effort normal and breath sounds normal. No accessory muscle usage or stridor. No tachypnea and no bradypnea. No respiratory distress. She has no wheezes. She has no rhonchi. She has no rales.  Abdominal: Soft. She exhibits no distension. There is no tenderness.  Musculoskeletal: She exhibits no edema or tenderness.  Neurological: She is alert and oriented to person, place, and time.  Skin: Skin is warm and dry. No rash noted. She is not diaphoretic. No erythema.  Psychiatric: She has a normal mood and affect.  Vitals reviewed.   ED Results and  Treatments Labs (all labs ordered are listed, but only abnormal results are displayed) Labs Reviewed  CBC - Abnormal; Notable for the following components:  Result Value   RBC 3.46 (*)    Hemoglobin 8.1 (*)    HCT 27.7 (*)    MCH 23.4 (*)    MCHC 29.2 (*)    RDW 16.3 (*)    All other components within normal limits  URINALYSIS, ROUTINE W REFLEX MICROSCOPIC - Abnormal; Notable for the following components:   Hgb urine dipstick MODERATE (*)    Bacteria, UA RARE (*)    All other components within normal limits  BASIC METABOLIC PANEL  I-STAT BETA HCG BLOOD, ED ([facility], [facility], [facility] ONLY)  CBG MONITORING, ED                                                                                                                         EKG  EKG Interpretation  Date/Time:  Monday December 01 2017 21:32:26 EDT Ventricular Rate:  88 PR Interval:  166 QRS Duration: 74 QT Interval:  360 QTC Calculation: 435 R Axis:   23 Text Interpretation:  Normal sinus rhythm Normal ECG No significant change since last tracing Confirmed by Drema Pryardama, Jillana Selph [phone]([id number]) on 12/01/2017 11:54:31 PM      Radiology Dg Chest 2 View  Result Date: 12/01/2017 CLINICAL DATA:  37 y/o F; shortness of breath for 1 week. Near syncope. EXAM: CHEST - 2 VIEW COMPARISON:  11/30/2017 chest radiograph FINDINGS: Stable cardiac silhouette given projection and technique. No consolidation, effusion, or pneumothorax. Bones are unremarkable. IMPRESSION: No acute pulmonary process identified. Electronically Signed   By: Mitzi HansenLance  Furusawa-Stratton M.D.   On: 12/01/2017 22:05   Pertinent labs & imaging results that were available during my care of the patient were reviewed by me and considered in my medical decision making (see chart for details).  Medications Ordered in ED Medications - No data to display                                                                                                                                   Procedures Procedures  (including critical care time)  Medical Decision Making / ED Course I have reviewed the nursing notes for this encounter and the patient's prior records (if available in EHR or on provided paperwork).    Episodic shortness of breath.  Currently asymptomatic.  Patient was seen yesterday for the same and given albuterol inhaler which has not provided any relief.  EKG nonischemic without evidence of pericarditis.  Chest x-ray reviewed  by me and without evidence of pneumonia, pulmonary vascular congestion, pulmonary edema, pleural effusions, or pneumothorax.  Screening labs without significant electrolyte derangements.  CBC did reveal hemoglobin of 8.1 which is similar to 2 years ago.  Prior to that patient had hemoglobin of 9.  She is not anticoagulated.  Symptoms may be related to her anemia.  Doubt pulmonary embolism.  No respiratory distress at this time.  Recommended restarting iron therapy and PCP follow-up.  Final Clinical Impression(s) / ED Diagnoses Final diagnoses:  Shortness of breath  Other iron deficiency anemia   Disposition: Discharge  Condition: Good  I have discussed the results, Dx and Tx plan with the patient who expressed understanding and agree(s) with the plan. Discharge instructions discussed at great length. The patient was given strict return precautions who verbalized understanding of the instructions. No further questions at time of discharge.    ED Discharge Orders         Ordered    ferrous gluconate (FERGON) 324 MG tablet  2 times daily with meals     12/02/17 0016           Follow Up: Alysia Penna, MD [address] [place] [address] [phone]   For close follow up to assess for anemia and shortness of breath      This chart was dictated using voice recognition software.  Despite best efforts to proofread,  errors can occur which can change the documentation meaning.  Nira Conn,  MD 12/02/17 [phone]

## 2017-12-28 ENCOUNTER — Encounter (HOSPITAL_COMMUNITY): Payer: Self-pay

## 2017-12-28 ENCOUNTER — Other Ambulatory Visit: Payer: Self-pay

## 2017-12-28 ENCOUNTER — Emergency Department (HOSPITAL_COMMUNITY)
Admission: EM | Admit: 2017-12-28 | Discharge: 2017-12-28 | Disposition: A | Discharge status: Home / Self Care | Payer: Self-pay | Attending: Emergency Medicine | Admitting: Emergency Medicine

## 2017-12-28 DIAGNOSIS — Z79899 Other long term (current) drug therapy: Secondary | ICD-10-CM | POA: Insufficient documentation

## 2017-12-28 DIAGNOSIS — H6691 Otitis media, unspecified, right ear: Secondary | ICD-10-CM | POA: Insufficient documentation

## 2017-12-28 DIAGNOSIS — Z87891 Personal history of nicotine dependence: Secondary | ICD-10-CM | POA: Insufficient documentation

## 2017-12-28 MED ORDER — AMOXICILLIN 500 MG PO CAPS
500.0000 mg | ORAL_CAPSULE | Freq: Once | ORAL | Status: AC
  Administered 2017-12-28: 500 mg via ORAL
  Filled 2017-12-28: qty 1

## 2017-12-28 MED ORDER — AMOXICILLIN 500 MG PO CAPS: 500.0000 mg | ORAL_CAPSULE | Freq: Three times a day (TID) | ORAL | 0 refills | Status: DC

## 2017-12-28 MED ORDER — NAPROXEN 500 MG PO TABS: 500.0000 mg | ORAL_TABLET | Freq: Two times a day (BID) | ORAL | 0 refills | Status: DC

## 2017-12-28 MED ORDER — OXYCODONE-ACETAMINOPHEN 5-325 MG PO TABS
1.0000 | ORAL_TABLET | Freq: Once | ORAL | Status: AC
  Administered 2017-12-28: 1 via ORAL
  Filled 2017-12-28: qty 1

## 2017-12-28 NOTE — ED Provider Notes (Signed)
[facility]   CSN: [id number] Arrival date & time: 12/28/17  1553     History   Chief Complaint Chief Complaint  Patient presents with  . Otalgia    HPI Megan Parks is a 37 y.o. female who presents to the ED with right ear pain. The pain started this morning and now is severe. Patient reports she noted ringing in her ear 2 days ago before the pain started. Patient denies fever, chills or other problems.   HPI  Past Medical History:  Diagnosis Date  . Anemia 06/29/2013  . Dysmenorrhea     Patient Active Problem List   Diagnosis Date Noted  . 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 Menorrhagia with irregular cycle 06/29/2013  . Smoker 06/29/2013  . Anemia 06/29/2013  . Obesity (BMI 30-39.9) 06/29/2013    Past Surgical History:  Procedure Laterality Date  . CESAREAN SECTION  2001  . CESAREAN SECTION WITH BILATERAL TUBAL LIGATION  2002     OB History    Gravida  2   Para  2   Term  2   Preterm      AB      Living  2     SAB      TAB      Ectopic      Multiple      Live Births  2            Home Medications    Prior to Admission medications   Medication Sig Start Date End Date Taking? Authorizing Provider  amoxicillin (AMOXIL) 500 MG capsule Take 1 capsule (500 mg total) by mouth 3 (three) times daily. 12/28/17   Janne NapoleonNeese, Catalina Salasar M, NP  cyclobenzaprine (FLEXERIL) 10 MG tablet Take 1 tablet (10 mg total) by mouth 2 (two) times daily as needed for muscle spasms. 07/21/17   Liberty HandyGibbons, Claudia J, PA-C  diphenhydrAMINE (BENADRYL) 25 MG tablet Take 1 tablet (25 mg total) by mouth every 6 (six) hours. 05/14/15   Joy, Shawn C, PA-C  ferrous gluconate (FERGON) 324 MG tablet Take 1 tablet (324 mg total) by mouth 2 (two) times daily with a meal. 12/02/17 01/01/18  Cardama, Amadeo GarnetPedro Eduardo, MD  hydrOXYzine (ATARAX/VISTARIL) 25 MG tablet Take 1 tablet (25 mg total) by mouth every 6 (six) hours. 05/18/15   Geoffery Lyonselo,  Douglas, MD  ibuprofen (ADVIL,MOTRIN) 600 MG tablet Take 1 tablet (600 mg total) by mouth every 6 (six) hours as needed for moderate pain. 10/02/16   Fayrene Helperran, Bowie, PA-C  methocarbamol (ROBAXIN) 500 MG tablet Take 1 tablet (500 mg total) by mouth 2 (two) times daily. 07/21/17   Liberty HandyGibbons, Claudia J, PA-C  naproxen (NAPROSYN) 500 MG tablet Take 1 tablet (500 mg total) by mouth 2 (two) times daily. 12/28/17   Janne NapoleonNeese, Dave Mergen M, NP  neomycin-polymyxin-hydrocortisone (CORTISPORIN) otic solution Apply to affected areas on toes twice daily 12/27/14   DentonHyatt, OklahomaMax T, DPM  permethrin (ELIMITE) 5 % cream Apply to affected area once 05/18/15   Geoffery Lyonselo, Douglas, MD    Family History Family History  Problem Relation Age of Onset  . Diabetes Mother   . Breast cancer Paternal Aunt   . Diabetes Maternal Grandmother   . Hypertension Maternal Grandmother     Social History Social History   Tobacco Use  . Smoking status: Former Smoker    Packs/day: 0.50    Years: 17.00    Pack years: 8.50    Types: Cigarettes  Last attempt to quit: 11/27/2017    Years since quitting: 0.0  . Smokeless tobacco: Never Used  . Tobacco comment: One pack every two days  Substance Use Topics  . Alcohol use: No  . Drug use: No     Allergies   Patient has no known allergies.   Review of Systems Review of Systems  Constitutional: Negative for chills and fever.  HENT: Positive for ear pain and tinnitus. Negative for sore throat.   All other systems reviewed and are negative.    Physical Exam Updated Vital Signs BP (!) 142/96 (BP Location: Right Arm)   Pulse 86   Temp 98.7 F (37.1 C) (Oral)   Resp 18   SpO2 99%   Physical Exam  Constitutional: She is oriented to person, place, and time. She appears well-developed and well-nourished. No distress.  HENT:  Head: Normocephalic and atraumatic.  Right Ear: No mastoid tenderness. Tympanic membrane is erythematous and bulging.  Left Ear: Tympanic membrane normal.  Nose: Nose  normal.  Mouth/Throat: Oropharynx is clear and moist.  Eyes: Pupils are equal, round, and reactive to light. Conjunctivae and EOM are normal.  Neck: Normal range of motion. Neck supple.  Cardiovascular: Normal rate and regular rhythm.  Pulmonary/Chest: Effort normal.  Musculoskeletal: Normal range of motion.  Neurological: She is alert and oriented to person, place, and time.  Skin: Skin is warm and dry.  Psychiatric: She has a normal mood and affect. Her behavior is normal.  Nursing note and vitals reviewed.    ED Treatments / Results  Labs (all labs ordered are listed, but only abnormal results are displayed) Labs Reviewed - No data to display  Radiology No results found.  Procedures Procedures (including critical care time)  Medications Ordered in ED Medications  amoxicillin (AMOXIL) capsule 500 mg (500 mg Oral Given 12/28/17 1620)  oxyCODONE-acetaminophen (PERCOCET/ROXICET) 5-325 MG per tablet 1 tablet (1 tablet Oral Given 12/28/17 1620)     Initial Impression / Assessment and Plan / ED Course  I have reviewed the triage vital signs and the nursing notes. Patient presents with otalgia and exam consistent with acute otitis media. No concern for acute mastoiditis, meningitis.  No antibiotic use in the last month.  Patient discharged home with Amoxicillin.  Advised patient to call PCPfor follow-up.  I have also discussed reasons to return immediately to the ER.  Patient expresses understanding and agrees with plan.    Final Clinical Impressions(s) / ED Diagnoses   Final diagnoses:  Acute otitis media, right    ED Discharge Orders         Ordered    amoxicillin (AMOXIL) 500 MG capsule  3 times daily     12/28/17 1608    naproxen (NAPROSYN) 500 MG tablet  2 times daily     12/28/17 1608           Damian Leavell [place], NP 12/29/17 0105    Virgina Norfolk, DO 12/30/17 0141

## 2017-12-28 NOTE — ED Triage Notes (Signed)
Pt BIB POV for eval of R sided ear pain onset this AM. Pt reports that she started with tinnitus 2 days PTA, and pain began this AM. Denies fever/chills.

## 2017-12-28 NOTE — Discharge Instructions (Addendum)
Take the medication as directed. Follow up with your doctor in one week for recheck to be sure the ear is healing.  Return here for worsening symptoms.

## 2019-02-01 ENCOUNTER — Emergency Department (HOSPITAL_COMMUNITY): Payer: Self-pay

## 2019-02-01 ENCOUNTER — Encounter (HOSPITAL_COMMUNITY): Payer: Self-pay | Admitting: Emergency Medicine

## 2019-02-01 ENCOUNTER — Emergency Department (HOSPITAL_COMMUNITY)
Admission: EM | Admit: 2019-02-01 | Discharge: 2019-02-01 | Disposition: A | Discharge status: Home / Self Care | Payer: Self-pay | Attending: Emergency Medicine | Admitting: Emergency Medicine

## 2019-02-01 ENCOUNTER — Other Ambulatory Visit: Payer: Self-pay

## 2019-02-01 DIAGNOSIS — R079 Chest pain, unspecified: Secondary | ICD-10-CM | POA: Insufficient documentation

## 2019-02-01 DIAGNOSIS — R05 Cough: Secondary | ICD-10-CM | POA: Insufficient documentation

## 2019-02-01 DIAGNOSIS — J069 Acute upper respiratory infection, unspecified: Secondary | ICD-10-CM

## 2019-02-01 DIAGNOSIS — Z20828 Contact with and (suspected) exposure to other viral communicable diseases: Secondary | ICD-10-CM | POA: Insufficient documentation

## 2019-02-01 DIAGNOSIS — Z87891 Personal history of nicotine dependence: Secondary | ICD-10-CM | POA: Insufficient documentation

## 2019-02-01 DIAGNOSIS — R059 Cough, unspecified: Secondary | ICD-10-CM

## 2019-02-01 DIAGNOSIS — Z20822 Contact with and (suspected) exposure to covid-19: Secondary | ICD-10-CM

## 2019-02-01 LAB — CBC
HCT: 30.5 % — ABNORMAL LOW (ref 36.0–46.0)
Hemoglobin: 9.4 g/dL — ABNORMAL LOW (ref 12.0–15.0)
MCH: 24.4 pg — ABNORMAL LOW (ref 26.0–34.0)
MCHC: 30.8 g/dL (ref 30.0–36.0)
MCV: 79.2 fL — ABNORMAL LOW (ref 80.0–100.0)
Platelets: 391 10*3/uL (ref 150–400)
RBC: 3.85 MIL/uL — ABNORMAL LOW (ref 3.87–5.11)
RDW: 16.2 % — ABNORMAL HIGH (ref 11.5–15.5)
WBC: 9.4 10*3/uL (ref 4.0–10.5)
nRBC: 0 % (ref 0.0–0.2)

## 2019-02-01 LAB — BASIC METABOLIC PANEL
Anion gap: 7 (ref 5–15)
BUN: 13 mg/dL (ref 6–20)
CO2: 21 mmol/L — ABNORMAL LOW (ref 22–32)
Calcium: 8.5 mg/dL — ABNORMAL LOW (ref 8.9–10.3)
Chloride: 107 mmol/L (ref 98–111)
Creatinine, Ser: 0.73 mg/dL (ref 0.44–1.00)
GFR calc Af Amer: >60 mL/min (ref >60)
GFR calc non Af Amer: >60 mL/min (ref >60)
Glucose, Bld: 101 mg/dL — ABNORMAL HIGH (ref 70–99)
Potassium: 3.9 mmol/L (ref 3.5–5.1)
Sodium: 135 mmol/L (ref 135–145)

## 2019-02-01 LAB — I-STAT BETA HCG BLOOD, ED (MC, WL, AP ONLY): I-stat hCG, quantitative: <5 m[IU]/mL (ref <5)

## 2019-02-01 LAB — TROPONIN I (HIGH SENSITIVITY): Troponin I (High Sensitivity): <2 ng/L (ref <18)

## 2019-02-01 NOTE — ED Notes (Signed)
Patient refused vitals recheck. States "im pretty sure it ain't went up or down"

## 2019-02-01 NOTE — Discharge Instructions (Signed)
Recommend following isolation precautions until we have the results of your coronavirus test.  If you develop difficulty breathing, chest pain, or other new concerning symptom, recommend returning to ER for reassessment.  Otherwise recommend following up with your primary doctor for recheck.  Recommend Tylenol, Motrin as needed.

## 2019-02-01 NOTE — ED Provider Notes (Signed)
[facility]   CSN: [id number] Arrival date & time: 02/01/19  1730     History   Chief Complaint Chief Complaint  Patient presents with  . Cough  . Chest Pain    HPI Megan Parks is a 38 y.o. female.  Presents to ER with cough, chest pain.  Patient reports cough started 4 days ago, mostly dry, but occasionally having clear phlegm, no blood.  Chest discomfort started yesterday, only happens when she is having bad coughing spells.  Currently not having chest pain.  States pain is sharp, jabbing pain.  No fevers.  No sick contacts.  No ear pain, no sore throat.  Denies any chronic medical problems.     HPI  Past Medical History:  Diagnosis Date  . Anemia 06/29/2013  . Dysmenorrhea     Patient Active Problem List   Diagnosis Date Noted  . 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 Menorrhagia with irregular cycle 06/29/2013  . Smoker 06/29/2013  . Anemia 06/29/2013  . Obesity (BMI 30-39.9) 06/29/2013    Past Surgical History:  Procedure Laterality Date  . CESAREAN SECTION  2001  . CESAREAN SECTION WITH BILATERAL TUBAL LIGATION  2002     OB History    Gravida  2   Para  2   Term  2   Preterm      AB      Living  2     SAB      TAB      Ectopic      Multiple      Live Births  2            Home Medications    Prior to Admission medications   Medication Sig Start Date End Date Taking? Authorizing Provider  amoxicillin (AMOXIL) 500 MG capsule Take 1 capsule (500 mg total) by mouth 3 (three) times daily. 12/28/17   Janne NapoleonNeese, Hope M, NP  cyclobenzaprine (FLEXERIL) 10 MG tablet Take 1 tablet (10 mg total) by mouth 2 (two) times daily as needed for muscle spasms. 07/21/17   Liberty HandyGibbons, Claudia J, PA-C  diphenhydrAMINE (BENADRYL) 25 MG tablet Take 1 tablet (25 mg total) by mouth every 6 (six) hours. 05/14/15   Joy, Shawn C, PA-C  ferrous gluconate (FERGON) 324 MG tablet Take 1 tablet (324 mg total) by mouth 2  (two) times daily with a meal. 12/02/17 01/01/18  Cardama, Amadeo GarnetPedro Eduardo, MD  hydrOXYzine (ATARAX/VISTARIL) 25 MG tablet Take 1 tablet (25 mg total) by mouth every 6 (six) hours. 05/18/15   Geoffery Lyonselo, Douglas, MD  ibuprofen (ADVIL,MOTRIN) 600 MG tablet Take 1 tablet (600 mg total) by mouth every 6 (six) hours as needed for moderate pain. 10/02/16   Fayrene Helperran, Bowie, PA-C  methocarbamol (ROBAXIN) 500 MG tablet Take 1 tablet (500 mg total) by mouth 2 (two) times daily. 07/21/17   Liberty HandyGibbons, Claudia J, PA-C  naproxen (NAPROSYN) 500 MG tablet Take 1 tablet (500 mg total) by mouth 2 (two) times daily. 12/28/17   Janne NapoleonNeese, Hope M, NP  neomycin-polymyxin-hydrocortisone (CORTISPORIN) otic solution Apply to affected areas on toes twice daily 12/27/14   BarringtonHyatt, OklahomaMax T, DPM  permethrin (ELIMITE) 5 % cream Apply to affected area once 05/18/15   Geoffery Lyonselo, Douglas, MD    Family History Family History  Problem Relation Age of Onset  . Diabetes Mother   . Breast cancer Paternal Aunt   . Diabetes Maternal Grandmother   . Hypertension Maternal Grandmother  Social History Social History   Tobacco Use  . Smoking status: Former Smoker    Packs/day: 0.50    Years: 17.00    Pack years: 8.50    Types: Cigarettes    Quit date: 11/27/2017    Years since quitting: 1.1  . Smokeless tobacco: Never Used  . Tobacco comment: One pack every two days  Substance Use Topics  . Alcohol use: No  . Drug use: No     Allergies   Patient has no known allergies.   Review of Systems Review of Systems  Constitutional: Negative for chills and fever.  HENT: Negative for ear pain and sore throat.   Eyes: Negative for pain and visual disturbance.  Respiratory: Positive for cough. Negative for shortness of breath.   Cardiovascular: Positive for chest pain. Negative for palpitations.  Gastrointestinal: Negative for abdominal pain and vomiting.  Genitourinary: Negative for dysuria and hematuria.  Musculoskeletal: Negative for arthralgias and back  pain.  Skin: Negative for color change and rash.  Neurological: Negative for seizures and syncope.  All other systems reviewed and are negative.    Physical Exam Updated Vital Signs BP (!) 141/81 (BP Location: Left Arm)   Pulse 84   Temp 98.7 F (37.1 C) (Oral)   Resp 18   LMP 01/25/2019   SpO2 99%   Physical Exam Vitals signs and nursing note reviewed.  Constitutional:      General: She is not in acute distress.    Appearance: She is well-developed.  HENT:     Head: Normocephalic and atraumatic.  Eyes:     Conjunctiva/sclera: Conjunctivae normal.  Neck:     Musculoskeletal: Neck supple.  Cardiovascular:     Rate and Rhythm: Normal rate and regular rhythm.     Heart sounds: No murmur.  Pulmonary:     Effort: Pulmonary effort is normal. No respiratory distress.     Breath sounds: Normal breath sounds.     Comments: Tenderness palpation over anterior chest wall Abdominal:     Palpations: Abdomen is soft.     Tenderness: There is no abdominal tenderness.  Skin:    General: Skin is warm and dry.  Neurological:     Mental Status: She is alert.      ED Treatments / Results  Labs (all labs ordered are listed, but only abnormal results are displayed) Labs Reviewed  BASIC METABOLIC PANEL - Abnormal; Notable for the following components:      Result Value   CO2 21 (*)    Glucose, Bld 101 (*)    Calcium 8.5 (*)    All other components within normal limits  CBC - Abnormal; Notable for the following components:   RBC 3.85 (*)    Hemoglobin 9.4 (*)    HCT 30.5 (*)    MCV 79.2 (*)    MCH 24.4 (*)    RDW 16.2 (*)    All other components within normal limits  SARS CORONAVIRUS 2 (TAT 6-24 HRS)  I-STAT BETA HCG BLOOD, ED ([facility], [facility], [facility] ONLY)  TROPONIN I (HIGH SENSITIVITY)  TROPONIN I (HIGH SENSITIVITY)    EKG EKG Interpretation  Date/Time:  Monday February 01 2019 17:51:00 EDT Ventricular Rate:  92 PR Interval:  178 QRS Duration: 76 QT Interval:  360 QTC  Calculation: 445 R Axis:   27 Text Interpretation:  Normal sinus rhythm Normal ECG Confirmed by Marianna Fuss ([id number]) on 02/01/2019 10:43:09 PM   Radiology Dg Chest 2 View  Result Date: 02/01/2019  CLINICAL DATA:  Cough, chest pain EXAM: CHEST - 2 VIEW COMPARISON:  12/01/2017 FINDINGS: The heart size and mediastinal contours are within normal limits. Bandlike scarring or atelectasis of the left midlung. The visualized skeletal structures are unremarkable. IMPRESSION: No acute abnormality of the lungs. Electronically Signed   By: Eddie Candle M.D.   On: 02/01/2019 18:42    Procedures Procedures (including critical care time)  Medications Ordered in ED Medications - No data to display   Initial Impression / Assessment and Plan / ED Course  I have reviewed the triage vital signs and the nursing notes.  Pertinent labs & imaging results that were available during my care of the patient were reviewed by me and considered in my medical decision making (see chart for details).       38 year old lady otherwise healthy presents to ER with chest pain, cough.  X-ray negative, labs unremarkable, EKG without ischemic changes.  Suspect viral upper respiratory illness.  Will check for Covid.  She is well-appearing with clear lungs and normal vital signs.  Believe she is appropriate for outpatient management this time.  Recommend isolation cautions until Covid results.  Will discharge home.    After the discussed management above, the patient was determined to be safe for discharge.  The patient was in agreement with this plan and all questions regarding their care were answered.  ED return precautions were discussed and the patient will return to the ED with any significant worsening of condition.    Final Clinical Impressions(s) / ED Diagnoses   Final diagnoses:  Cough  Viral upper respiratory tract infection  Suspected COVID-19 virus infection    ED Discharge Orders    None        Lucrezia Starch, MD 02/01/19 2310

## 2019-02-01 NOTE — ED Triage Notes (Signed)
Patient c/o cough x 4 days. Tightness in chest with shortness of breath onset of yesterday. Central chest pain radiating into left rib cage.

## 2019-02-02 LAB — SARS CORONAVIRUS 2 (TAT 6-24 HRS): SARS Coronavirus 2: NEGATIVE

## 2019-04-09 ENCOUNTER — Emergency Department (HOSPITAL_COMMUNITY)
Admission: EM | Admit: 2019-04-09 | Discharge: 2019-04-09 | Disposition: A | Discharge status: Home / Self Care | Payer: Self-pay | Attending: Emergency Medicine | Admitting: Emergency Medicine

## 2019-04-09 ENCOUNTER — Encounter (HOSPITAL_COMMUNITY): Payer: Self-pay | Admitting: Emergency Medicine

## 2019-04-09 ENCOUNTER — Other Ambulatory Visit: Payer: Self-pay

## 2019-04-09 DIAGNOSIS — Z87891 Personal history of nicotine dependence: Secondary | ICD-10-CM | POA: Insufficient documentation

## 2019-04-09 DIAGNOSIS — Z79899 Other long term (current) drug therapy: Secondary | ICD-10-CM | POA: Insufficient documentation

## 2019-04-09 DIAGNOSIS — K0889 Other specified disorders of teeth and supporting structures: Secondary | ICD-10-CM | POA: Insufficient documentation

## 2019-04-09 MED ORDER — PENICILLIN V POTASSIUM 500 MG PO TABS: 500.0000 mg | ORAL_TABLET | Freq: Four times a day (QID) | ORAL | 0 refills | Status: AC

## 2019-04-09 MED ORDER — BENZOCAINE 20 % MT GEL: 1.0000 "application " | Freq: Three times a day (TID) | OROMUCOSAL | 0 refills | Status: DC | PRN

## 2019-04-09 NOTE — Discharge Instructions (Addendum)
Dental Assistance If the dentist on-call cannot see you, please use the resources below:   Patients with Medicaid: [place] [facility] [place], [place] [address], [phone]  If unable to pay, or uninsured, contact [facility] [phone]) or [place] [phone] in [place], [place] in [facility]) to become qualified for the adult dental clinic  Other [place]- [place], [place], [place], [address]    [phone], Ext. [phone]    2nd and 4th Thursday of the month at 6:30am    10 clients each day by appointment, can sometimes see walk-in     patients if someone does not show for an appointment [facility]- [address] [place], [place], [address]    [phone] [facility]- [address], [place], [place], [address]    [phone]  [place] [phone]  I am sending you home with an antibiotic that you will take 4 times a day for 7 days. Finish all antibiotics even if you are feeling better. I am also prescribing you numbing gel.   [email or web address]  Above is the website for the [facility] dental clinic. Call on Monday to see if you can schedule an appointment. I highly recommend seeing a dentist to fix the underlying issue. Return to the ER for new or worsening symptoms.

## 2019-04-09 NOTE — ED Provider Notes (Signed)
[facility] Provider Note   CSN: [id number] Arrival date & time: 04/09/19  1956     History Chief Complaint  Patient presents with  . Dental Pain    Megan Parks is a 38 y.o. female with a past medical history significant for anemia who presents to the ED due to sudden onset of top left dental pain that started today. Patient notes she has had a broken tooth in the area for about 2 years which has caused no issues until today. Patient has tried Aleve and Orajel with moderate relief. Patient admits that pain is aggravated by eating. Patient does not see a dentist regularly. Patient denies fever and chills. Patient denies difficulties swallowing, difficulties opening her mouth, and tongue pain.    Past Medical History:  Diagnosis Date  . Anemia 06/29/2013  . Dysmenorrhea     Patient Active Problem List   Diagnosis Date Noted  . 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 Menorrhagia with irregular cycle 06/29/2013  . Smoker 06/29/2013  . Anemia 06/29/2013  . Obesity (BMI 30-39.9) 06/29/2013    Past Surgical History:  Procedure Laterality Date  . CESAREAN SECTION  2001  . CESAREAN SECTION WITH BILATERAL TUBAL LIGATION  2002     OB History    Gravida  2   Para  2   Term  2   Preterm      AB      Living  2     SAB      TAB      Ectopic      Multiple      Live Births  2           Family History  Problem Relation Age of Onset  . Diabetes Mother   . Breast cancer Paternal Aunt   . Diabetes Maternal Grandmother   . Hypertension Maternal Grandmother     Social History   Tobacco Use  . Smoking status: Former Smoker    Packs/day: 0.50    Years: 17.00    Pack years: 8.50    Types: Cigarettes    Quit date: 11/27/2017    Years since quitting: 1.3  . Smokeless tobacco: Never Used  . Tobacco comment: One pack every two days  Substance Use Topics  . Alcohol use: No  . Drug use: No    Home Medications Prior to  Admission medications   Medication Sig Start Date End Date Taking? Authorizing Provider  amoxicillin (AMOXIL) 500 MG capsule Take 1 capsule (500 mg total) by mouth 3 (three) times daily. 12/28/17   Janne Napoleon, NP  benzocaine (HURRICAINE) 20 % GEL Use as directed 1 application in the mouth or throat 3 (three) times daily as needed. 04/09/19   Mannie Stabile, PA-C  cyclobenzaprine (FLEXERIL) 10 MG tablet Take 1 tablet (10 mg total) by mouth 2 (two) times daily as needed for muscle spasms. 07/21/17   Liberty Handy, PA-C  diphenhydrAMINE (BENADRYL) 25 MG tablet Take 1 tablet (25 mg total) by mouth every 6 (six) hours. 05/14/15   Joy, Shawn C, PA-C  ferrous gluconate (FERGON) 324 MG tablet Take 1 tablet (324 mg total) by mouth 2 (two) times daily with a meal. 12/02/17 01/01/18  Cardama, Amadeo Garnet, MD  hydrOXYzine (ATARAX/VISTARIL) 25 MG tablet Take 1 tablet (25 mg total) by mouth every 6 (six) hours. 05/18/15   [name], MD  ibuprofen (ADVIL,MOTRIN) 600 MG tablet Take 1 tablet (600 mg total) by mouth  every 6 (six) hours as needed for moderate pain. 10/02/16   Fayrene Helperran, Bowie, PA-C  methocarbamol (ROBAXIN) 500 MG tablet Take 1 tablet (500 mg total) by mouth 2 (two) times daily. 07/21/17   Liberty HandyGibbons, Claudia J, PA-C  naproxen (NAPROSYN) 500 MG tablet Take 1 tablet (500 mg total) by mouth 2 (two) times daily. 12/28/17   Janne NapoleonNeese, Hope M, NP  neomycin-polymyxin-hydrocortisone (CORTISPORIN) otic solution Apply to affected areas on toes twice daily 12/27/14   Hyatt, Max T, DPM  penicillin v potassium (VEETID) 500 MG tablet Take 1 tablet (500 mg total) by mouth 4 (four) times daily for 7 days. 04/09/19 04/16/19  Mannie StabileAberman, Login Muckleroy C, PA-C  permethrin (ELIMITE) 5 % cream Apply to affected area once 05/18/15   Geoffery Lyonselo, Douglas, MD    Allergies    Patient has no known allergies.  Review of Systems   Review of Systems  Constitutional: Negative for chills and fever.  HENT: Positive for dental problem. Negative for  facial swelling, trouble swallowing and voice change.   Respiratory: Negative for shortness of breath.   Cardiovascular: Negative for chest pain.    Physical Exam Updated Vital Signs BP (!) 150/98 (BP Location: Left Arm)   Pulse 95   Temp 98.6 F (37 C) (Oral)   Resp 16   Ht 5\' 3"  (1.6 m)   Wt 99.8 kg   LMP 04/01/2019   SpO2 99%   BMI 38.97 kg/m   Physical Exam Vitals and nursing note reviewed.  Constitutional:      General: She is not in acute distress.    Appearance: She is not ill-appearing.  HENT:     Head: Normocephalic.     Mouth/Throat:     Comments: Poor dentition throughout with numerous missing teeth. Tenderness to palpation over top left front molar with surrounding gingivitis. No abscess noted. No trismus. No tenderness under tongue. Tongue in normal position without protrusion. Airway patent. No overlying cheek edema or erythema.  Eyes:     Pupils: Pupils are equal, round, and reactive to light.  Cardiovascular:     Rate and Rhythm: Normal rate and regular rhythm.     Pulses: Normal pulses.     Heart sounds: Normal heart sounds. No murmur. No friction rub. No gallop.   Pulmonary:     Effort: Pulmonary effort is normal.     Breath sounds: Normal breath sounds.  Abdominal:     General: Abdomen is flat. There is no distension.     Palpations: Abdomen is soft.     Tenderness: There is no abdominal tenderness. There is no guarding or rebound.  Musculoskeletal:     Cervical back: Neck supple.     Comments: Able to move all 4 extremities without difficulty.   Lymphadenopathy:     Cervical: No cervical adenopathy.  Neurological:     General: No focal deficit present.     Mental Status: She is alert.     ED Results / Procedures / Treatments   Labs (all labs ordered are listed, but only abnormal results are displayed) Labs Reviewed - No data to display  EKG None  Radiology No results found.  Procedures Procedures (including critical care  time)  Medications Ordered in ED Medications - No data to display  ED Course  I have reviewed the triage vital signs and the nursing notes.  Pertinent labs & imaging results that were available during my care of the patient were reviewed by me and considered in my medical decision  making (see chart for details).    MDM Rules/Calculators/A&P                       38 year old female presents to ED for evaluation of a toothache with mild surrounding gingivitis around broken front left molar. No gross abscess. No trismus. Tongue in normal position with no protrusion or tenderness.  Exam unconcerning for Ludwig's angina or spread of infection.  Will treat with penicillin, benzocaine, and anti-inflammatories medicine.  Urged patient to follow-up with dentist. Dental resources given to patient at discharge. Strict ED precautions discussed with patient. Patient states understanding and agrees to plan. Patient discharged home in no acute distress and stable vitals   Final Clinical Impression(s) / ED Diagnoses Final diagnoses:  Pain, dental    Rx / DC Orders ED Discharge Orders         Ordered    penicillin v potassium (VEETID) 500 MG tablet  4 times daily     04/09/19 2140    benzocaine (HURRICAINE) 20 % GEL  3 times daily PRN     04/09/19 2142           Karie Kirks 04/10/19 0153    Quintella Reichert, MD 04/13/19 1729

## 2019-04-09 NOTE — ED Triage Notes (Signed)
Pt arriving with dental pain on the top left side that began today. Pt reports the affected tooth has been broken off for over 2 years but began hurting today. Pt concerned for possible abscess. Pt took Aleve approx 4 hours prior to arrival.

## 2019-05-11 ENCOUNTER — Emergency Department (HOSPITAL_COMMUNITY): Payer: Self-pay

## 2019-05-11 ENCOUNTER — Other Ambulatory Visit: Payer: Self-pay

## 2019-05-11 ENCOUNTER — Encounter (HOSPITAL_COMMUNITY): Payer: Self-pay | Admitting: Radiology

## 2019-05-11 ENCOUNTER — Emergency Department (HOSPITAL_COMMUNITY)
Admission: EM | Admit: 2019-05-11 | Discharge: 2019-05-11 | Disposition: A | Discharge status: Home / Self Care | Payer: Self-pay | Attending: Emergency Medicine | Admitting: Emergency Medicine

## 2019-05-11 DIAGNOSIS — R079 Chest pain, unspecified: Secondary | ICD-10-CM | POA: Insufficient documentation

## 2019-05-11 DIAGNOSIS — U071 COVID-19: Secondary | ICD-10-CM

## 2019-05-11 DIAGNOSIS — Z87891 Personal history of nicotine dependence: Secondary | ICD-10-CM | POA: Insufficient documentation

## 2019-05-11 DIAGNOSIS — Y9289 Other specified places as the place of occurrence of the external cause: Secondary | ICD-10-CM | POA: Insufficient documentation

## 2019-05-11 DIAGNOSIS — Z79899 Other long term (current) drug therapy: Secondary | ICD-10-CM | POA: Insufficient documentation

## 2019-05-11 DIAGNOSIS — R0602 Shortness of breath: Secondary | ICD-10-CM | POA: Insufficient documentation

## 2019-05-11 LAB — CBC WITH DIFFERENTIAL/PLATELET
Abs Immature Granulocytes: 0.02 10*3/uL (ref 0.00–0.07)
Basophils Absolute: 0 10*3/uL (ref 0.0–0.1)
Basophils Relative: 0 %
Eosinophils Absolute: 0.1 10*3/uL (ref 0.0–0.5)
Eosinophils Relative: 1 %
HCT: 31.9 % — ABNORMAL LOW (ref 36.0–46.0)
Hemoglobin: 9.6 g/dL — ABNORMAL LOW (ref 12.0–15.0)
Immature Granulocytes: 0 %
Lymphocytes Relative: 25 %
Lymphs Abs: 1.6 10*3/uL (ref 0.7–4.0)
MCH: 23.2 pg — ABNORMAL LOW (ref 26.0–34.0)
MCHC: 30.1 g/dL (ref 30.0–36.0)
MCV: 77.2 fL — ABNORMAL LOW (ref 80.0–100.0)
Monocytes Absolute: 0.6 10*3/uL (ref 0.1–1.0)
Monocytes Relative: 9 %
Neutro Abs: 4.1 10*3/uL (ref 1.7–7.7)
Neutrophils Relative %: 65 %
Platelets: 343 10*3/uL (ref 150–400)
RBC: 4.13 MIL/uL (ref 3.87–5.11)
RDW: 16.1 % — ABNORMAL HIGH (ref 11.5–15.5)
WBC: 6.3 10*3/uL (ref 4.0–10.5)
nRBC: 0 % (ref 0.0–0.2)

## 2019-05-11 LAB — TROPONIN I (HIGH SENSITIVITY)
Troponin I (High Sensitivity): <2 ng/L (ref <18)
Troponin I (High Sensitivity): <2 ng/L (ref <18)

## 2019-05-11 LAB — COMPREHENSIVE METABOLIC PANEL
ALT: 12 U/L (ref 0–44)
AST: 13 U/L — ABNORMAL LOW (ref 15–41)
Albumin: 3.6 g/dL (ref 3.5–5.0)
Alkaline Phosphatase: 78 U/L (ref 38–126)
Anion gap: 7 (ref 5–15)
BUN: 10 mg/dL (ref 6–20)
CO2: 22 mmol/L (ref 22–32)
Calcium: 8.7 mg/dL — ABNORMAL LOW (ref 8.9–10.3)
Chloride: 110 mmol/L (ref 98–111)
Creatinine, Ser: 0.64 mg/dL (ref 0.44–1.00)
GFR calc Af Amer: >60 mL/min (ref >60)
GFR calc non Af Amer: >60 mL/min (ref >60)
Glucose, Bld: 94 mg/dL (ref 70–99)
Potassium: 3.6 mmol/L (ref 3.5–5.1)
Sodium: 139 mmol/L (ref 135–145)
Total Bilirubin: 0.7 mg/dL (ref 0.3–1.2)
Total Protein: 7.2 g/dL (ref 6.5–8.1)

## 2019-05-11 LAB — D-DIMER, QUANTITATIVE: D-Dimer, Quant: 0.62 ug/mL-FEU — ABNORMAL HIGH (ref 0.00–0.50)

## 2019-05-11 LAB — BRAIN NATRIURETIC PEPTIDE: B Natriuretic Peptide: 33.4 pg/mL (ref 0.0–100.0)

## 2019-05-11 MED ORDER — LORAZEPAM 2 MG/ML IJ SOLN
0.5000 mg | Freq: Once | INTRAMUSCULAR | Status: AC
  Administered 2019-05-11: 0.5 mg via INTRAVENOUS
  Filled 2019-05-11: qty 1

## 2019-05-11 MED ORDER — ALBUTEROL SULFATE HFA 108 (90 BASE) MCG/ACT IN AERS: 2.0000 | INHALATION_SPRAY | RESPIRATORY_TRACT | 0 refills | Status: DC | PRN

## 2019-05-11 MED ORDER — SODIUM CHLORIDE (PF) 0.9 % IJ SOLN
INTRAMUSCULAR | Status: AC
  Filled 2019-05-11: qty 50

## 2019-05-11 MED ORDER — IBUPROFEN 800 MG PO TABS: 800.0000 mg | ORAL_TABLET | Freq: Three times a day (TID) | ORAL | 0 refills | Status: DC | PRN

## 2019-05-11 MED ORDER — IOHEXOL 350 MG/ML SOLN
100.0000 mL | Freq: Once | INTRAVENOUS | Status: AC | PRN
  Administered 2019-05-11: 100 mL via INTRAVENOUS

## 2019-05-11 NOTE — ED Provider Notes (Signed)
[facility] DEPT Provider Note   CSN: [id number] Arrival date & time: 05/11/19  1151     History Chief Complaint  Patient presents with  . Shortness of Breath    COVID POSITIVE    Megan Parks is a 39 y.o. female.  Patient complains of some chest pain and shortness of breath.  Patient does not have history of Covid  The history is provided by the patient. No language interpreter was used.  Shortness of Breath Severity:  Mild Onset quality:  Sudden Timing:  Constant Progression:  Waxing and waning Chronicity:  New Context: not activity   Associated symptoms: chest pain   Associated symptoms: no abdominal pain, no cough, no headaches and no rash        Past Medical History:  Diagnosis Date  . Anemia 06/29/2013  . Dysmenorrhea     Patient Active Problem List   Diagnosis Date Noted  . 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 Menorrhagia with irregular cycle 06/29/2013  . Smoker 06/29/2013  . Anemia 06/29/2013  . Obesity (BMI 30-39.9) 06/29/2013    Past Surgical History:  Procedure Laterality Date  . CESAREAN SECTION  2001  . CESAREAN SECTION WITH BILATERAL TUBAL LIGATION  2002     OB History    Gravida  2   Para  2   Term  2   Preterm      AB      Living  2     SAB      TAB      Ectopic      Multiple      Live Births  2           Family History  Problem Relation Age of Onset  . Diabetes Mother   . Breast cancer Paternal Aunt   . Diabetes Maternal Grandmother   . Hypertension Maternal Grandmother     Social History   Tobacco Use  . Smoking status: Former Smoker    Packs/day: 0.50    Years: 17.00    Pack years: 8.50    Types: Cigarettes    Quit date: 11/27/2017    Years since quitting: 1.4  . Smokeless tobacco: Never Used  . Tobacco comment: One pack every two days  Substance Use Topics  . Alcohol use: No  . Drug use: No    Home Medications Prior to Admission medications   Medication  Sig Start Date End Date Taking? Authorizing Provider  albuterol (VENTOLIN HFA) 108 (90 Base) MCG/ACT inhaler Inhale 2 puffs into the lungs every 4 (four) hours as needed for wheezing or shortness of breath. 05/11/19   Milton Ferguson, MD  benzocaine (HURRICAINE) 20 % GEL Use as directed 1 application in the mouth or throat 3 (three) times daily as needed. Patient not taking: Reported on 05/11/2019 04/09/19   Suzy Bouchard, PA-C  cyclobenzaprine (FLEXERIL) 10 MG tablet Take 1 tablet (10 mg total) by mouth 2 (two) times daily as needed for muscle spasms. Patient not taking: Reported on 05/11/2019 07/21/17   Kinnie Feil, PA-C  diphenhydrAMINE (BENADRYL) 25 MG tablet Take 1 tablet (25 mg total) by mouth every 6 (six) hours. Patient not taking: Reported on 05/11/2019 05/14/15   Lorayne Bender, PA-C  ferrous gluconate (FERGON) 324 MG tablet Take 1 tablet (324 mg total) by mouth 2 (two) times daily with a meal. Patient not taking: Reported on 05/11/2019 12/02/17 01/01/18  Fatima Blank, MD  ibuprofen (ADVIL) 800 MG tablet  Take 1 tablet (800 mg total) by mouth every 8 (eight) hours as needed for moderate pain. 05/11/19   Bethann Berkshire, MD    Allergies    Patient has no known allergies.  Review of Systems   Review of Systems  Constitutional: Negative for appetite change and fatigue.  HENT: Negative for congestion, ear discharge and sinus pressure.   Eyes: Negative for discharge.  Respiratory: Positive for shortness of breath. Negative for cough.   Cardiovascular: Positive for chest pain.  Gastrointestinal: Negative for abdominal pain and diarrhea.  Genitourinary: Negative for frequency and hematuria.  Musculoskeletal: Negative for back pain.  Skin: Negative for rash.  Neurological: Negative for seizures and headaches.  Psychiatric/Behavioral: Negative for hallucinations.    Physical Exam Updated Vital Signs BP (!) 151/88   Pulse 76   Temp 99.2 F (37.3 C) (Oral)   Resp 18   Ht  5\' 4"  (1.626 m)   Wt 99.8 kg   LMP 04/13/2019   SpO2 100%   BMI 37.76 kg/m   Physical Exam Vitals and nursing note reviewed.  Constitutional:      Appearance: She is well-developed.  HENT:     Head: Normocephalic.     Nose: Nose normal.  Eyes:     General: No scleral icterus.    Conjunctiva/sclera: Conjunctivae normal.  Neck:     Thyroid: No thyromegaly.  Cardiovascular:     Rate and Rhythm: Normal rate and regular rhythm.     Heart sounds: No murmur. No friction rub. No gallop.   Pulmonary:     Breath sounds: No stridor. No wheezing or rales.  Chest:     Chest wall: No tenderness.  Abdominal:     General: There is no distension.     Tenderness: There is no abdominal tenderness. There is no rebound.  Musculoskeletal:        General: Normal range of motion.     Cervical back: Neck supple.  Lymphadenopathy:     Cervical: No cervical adenopathy.  Skin:    Findings: No erythema or rash.  Neurological:     Mental Status: She is alert and oriented to person, place, and time.     Motor: No abnormal muscle tone.     Coordination: Coordination normal.  Psychiatric:        Behavior: Behavior normal.     ED Results / Procedures / Treatments   Labs (all labs ordered are listed, but only abnormal results are displayed) Labs Reviewed  CBC WITH DIFFERENTIAL/PLATELET - Abnormal; Notable for the following components:      Result Value   Hemoglobin 9.6 (*)    HCT 31.9 (*)    MCV 77.2 (*)    MCH 23.2 (*)    RDW 16.1 (*)    All other components within normal limits  COMPREHENSIVE METABOLIC PANEL - Abnormal; Notable for the following components:   Calcium 8.7 (*)    AST 13 (*)    All other components within normal limits  D-DIMER, QUANTITATIVE (NOT AT [facility]) - Abnormal; Notable for the following components:   D-Dimer, Quant 0.62 (*)    All other components within normal limits  BRAIN NATRIURETIC PEPTIDE  TROPONIN I (HIGH SENSITIVITY)  TROPONIN I (HIGH SENSITIVITY)     EKG None  Radiology CT Angio Chest PE W and/or Wo Contrast  Result Date: 05/11/2019 CLINICAL DATA:  Shortness of breath EXAM: CT ANGIOGRAPHY CHEST WITH CONTRAST TECHNIQUE: Multidetector CT imaging of the chest was performed using the standard  protocol during bolus administration of intravenous contrast. Multiplanar CT image reconstructions and MIPs were obtained to evaluate the vascular anatomy. CONTRAST:  OMNIPAQUE IOHEXOL 350 MG/ML SOLN COMPARISON:  None. FINDINGS: Cardiovascular: Satisfactory opacification of the pulmonary arteries to the segmental level. No evidence of pulmonary embolism. Normal heart size. No pericardial effusion. Mediastinum/Nodes: No enlarged mediastinal, hilar, or axillary lymph nodes. Thyroid gland, trachea, and esophagus demonstrate no significant findings. Lungs/Pleura: Lungs are clear. No pleural effusion or pneumothorax. Mild left basilar and right middle lobe atelectasis. Upper Abdomen: No acute abnormality. Musculoskeletal: No chest wall abnormality. No acute or significant osseous findings. Review of the MIP images confirms the above findings. IMPRESSION: 1. No evidence of pulmonary embolus. 2. No acute cardiopulmonary disease. Electronically Signed   By: Elige Ko   On: 05/11/2019 15:27   DG Chest Port 1 View  Result Date: 05/11/2019 CLINICAL DATA:  Shortness of breath, COVID-19 positive EXAM: PORTABLE CHEST 1 VIEW COMPARISON:  02/01/2019 FINDINGS: The heart size and mediastinal contours are within normal limits. Minimal linear marking in the right mid lung. Otherwise, no focal airspace consolidation, pleural effusion, or pneumothorax the visualized skeletal structures are unremarkable. IMPRESSION: Minimal linear atelectasis in the right mid lung. Lungs appear otherwise clear. Electronically Signed   By: Duanne Guess D.O.   On: 05/11/2019 12:53    Procedures Procedures (including critical care time)  Medications Ordered in ED Medications  sodium  chloride (PF) 0.9 % injection (has no administration in time range)  LORazepam (ATIVAN) injection 0.5 mg (0.5 mg Intravenous Given 05/11/19 1252)  iohexol (OMNIPAQUE) 350 MG/ML injection 100 mL (100 mLs Intravenous Contrast Given 05/11/19 1458)    ED Course  I have reviewed the triage vital signs and the nursing notes.  Pertinent labs & imaging results that were available during my care of the patient were reviewed by me and considered in my medical decision making (see chart for details).    MDM Rules/Calculators/A&P                      Patient's labs showed an elevated D-dimer.  CT scan of the chest was negative for PE.  Atypical chest pain and nontoxic.  Patient will be discharged home with Motrin and albuterol Final Clinical Impression(s) / ED Diagnoses Final diagnoses:  COVID-19    Rx / DC Orders ED Discharge Orders         Ordered    ibuprofen (ADVIL) 800 MG tablet  Every 8 hours PRN     05/11/19 1558    albuterol (VENTOLIN HFA) 108 (90 Base) MCG/ACT inhaler  Every 4 hours PRN     05/11/19 1558           Bethann Berkshire, MD 05/11/19 1640

## 2019-05-11 NOTE — ED Triage Notes (Signed)
Pt arrived POV ambulatory into ED from home CC COVID POSITIVE Saturday 1/23 and SOB on exertion. Pt reports testing for COVID in October with a negative result.  VSS 100% on room air Afebrile

## 2019-05-11 NOTE — ED Notes (Signed)
Patient transported to CT 

## 2019-05-11 NOTE — Discharge Instructions (Addendum)
Follow-up with your doctor within a week to have your blood pressure rechecked.  Return sooner if any problems

## 2019-05-11 NOTE — ED Notes (Signed)
PT ambulatory in room 02 sats remain above 93% on RA

## 2019-05-11 NOTE — ED Triage Notes (Deleted)
Information only: pt tested positive for Covid 02/01/19, retested (+) Sat

## 2019-09-15 ENCOUNTER — Emergency Department (HOSPITAL_COMMUNITY)
Admission: EM | Admit: 2019-09-15 | Discharge: 2019-09-16 | Disposition: A | Discharge status: Home / Self Care | Payer: BLUE CROSS/BLUE SHIELD | Attending: Emergency Medicine | Admitting: Emergency Medicine

## 2019-09-15 ENCOUNTER — Encounter (HOSPITAL_COMMUNITY): Payer: Self-pay

## 2019-09-15 ENCOUNTER — Other Ambulatory Visit: Payer: Self-pay

## 2019-09-15 DIAGNOSIS — K0889 Other specified disorders of teeth and supporting structures: Secondary | ICD-10-CM | POA: Insufficient documentation

## 2019-09-15 DIAGNOSIS — Z5321 Procedure and treatment not carried out due to patient leaving prior to being seen by health care provider: Secondary | ICD-10-CM | POA: Diagnosis not present

## 2019-09-15 NOTE — ED Triage Notes (Signed)
Pt arrives POV for eval of L sided dental pain onset last night. Pt reports she had a tooth pulled about a week ago and she feels an abscess forming in that spot. Pt reports hx of same about 4x. Denies fevers/chills

## 2019-09-16 NOTE — ED Notes (Signed)
Pt stated that she would hit the urgent care in the morning and left

## 2022-01-13 IMAGING — DX DG CHEST 1V PORT
1 series · 1 of 1 positions shown · non-contrast
Comparison: 02/01/2019

CLINICAL DATA: Shortness of breath, D4DT8-G9 positive

EXAM:
PORTABLE CHEST 1 VIEW

[chest ap]
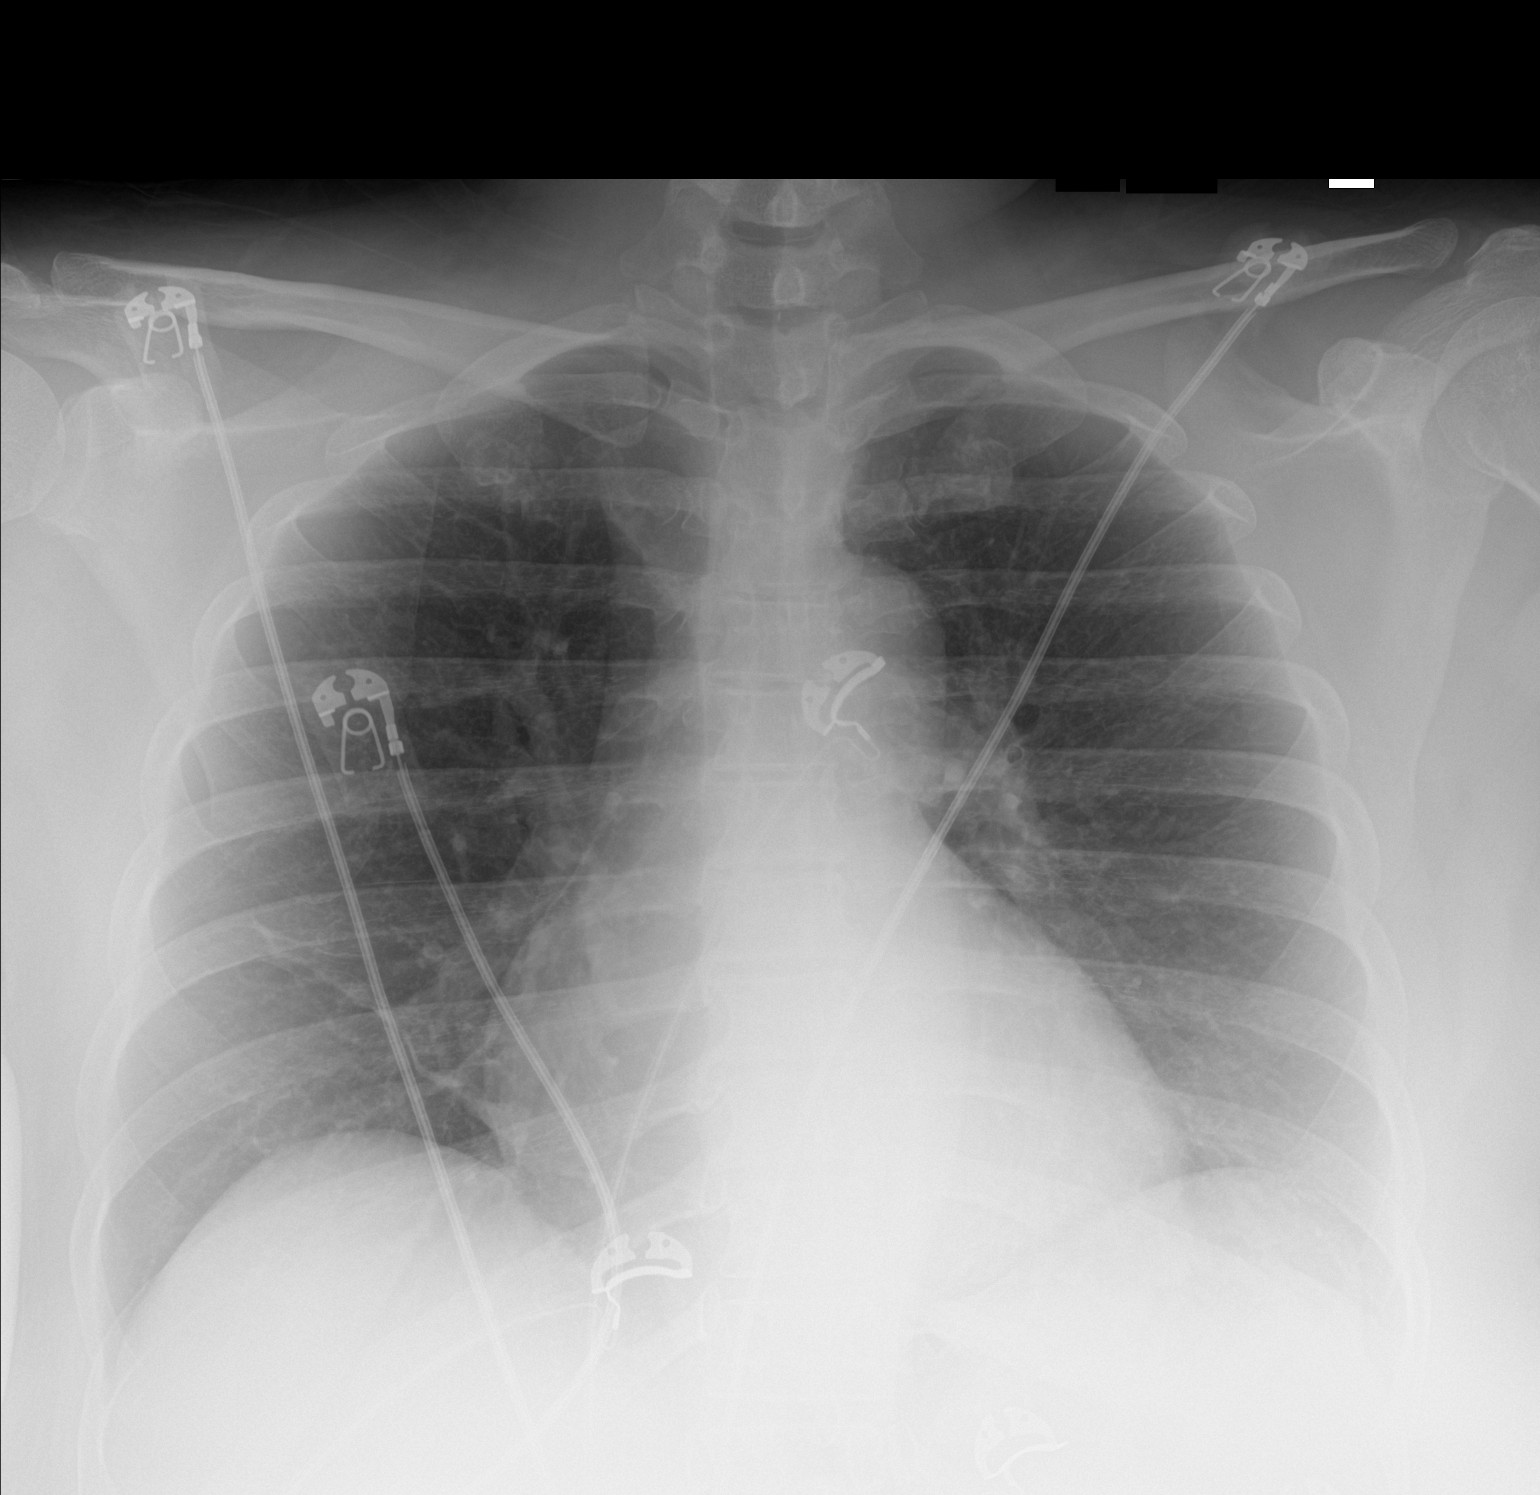

[1 of 1 positions shown; findings below may reference images not displayed]

FINDINGS: The heart size and mediastinal contours are within normal limits.
Minimal linear marking in the right mid lung. Otherwise, no focal
airspace consolidation, pleural effusion, or pneumothorax the
visualized skeletal structures are unremarkable.
IMPRESSION: Minimal linear atelectasis in the right mid lung. Lungs appear
otherwise clear.

## 2022-04-04 ENCOUNTER — Emergency Department (HOSPITAL_COMMUNITY): Payer: BLUE CROSS/BLUE SHIELD

## 2022-04-04 ENCOUNTER — Emergency Department (HOSPITAL_COMMUNITY)
Admission: EM | Admit: 2022-04-04 | Discharge: 2022-04-04 | Disposition: A | Discharge status: Home / Self Care | Payer: BLUE CROSS/BLUE SHIELD | Attending: Emergency Medicine | Admitting: Emergency Medicine

## 2022-04-04 DIAGNOSIS — J101 Influenza due to other identified influenza virus with other respiratory manifestations: Secondary | ICD-10-CM | POA: Insufficient documentation

## 2022-04-04 DIAGNOSIS — Z20822 Contact with and (suspected) exposure to covid-19: Secondary | ICD-10-CM | POA: Insufficient documentation

## 2022-04-04 DIAGNOSIS — J111 Influenza due to unidentified influenza virus with other respiratory manifestations: Secondary | ICD-10-CM

## 2022-04-04 LAB — RESP PANEL BY RT-PCR (RSV, FLU A&B, COVID)  RVPGX2
Influenza A by PCR: NEGATIVE
Influenza B by PCR: POSITIVE — AB
Resp Syncytial Virus by PCR: NEGATIVE
SARS Coronavirus 2 by RT PCR: NEGATIVE

## 2022-04-04 MED ORDER — OSELTAMIVIR PHOSPHATE 75 MG PO CAPS: 75.0000 mg | ORAL_CAPSULE | Freq: Two times a day (BID) | ORAL | 0 refills | Status: DC

## 2022-04-04 NOTE — Discharge Instructions (Signed)
You have flu B.  Take Tylenol and Motrin for fever  Take Tamiflu as prescribed  See your doctor for follow-up  Return to ER if you have worse cough or fever or trouble breathing

## 2022-04-04 NOTE — ED Provider Triage Note (Signed)
Emergency Medicine Provider Triage Evaluation Note  Nayelly Laughman , a 41 y.o. female  was evaluated in triage.  Pt complains of cough which has been ongoing for 1 week and body aches which began yesterday.  Patient denies known fevers at home, abdominal pain, nausea, vomiting  Review of Systems  Positive: As above Negative: As above  Physical Exam  BP (!) 166/97 (BP Location: Left Arm)   Pulse 96   Temp 98.3 F (36.8 C) (Oral)   Resp 16   SpO2 99%  Gen:   Awake, no distress   Resp:  Normal effort  MSK:   Moves extremities without difficulty  Other:    Medical Decision Making  Medically screening exam initiated at 2:57 PM.  Appropriate orders placed.  Emily Massar was informed that the remainder of the evaluation will be completed by another provider, this initial triage assessment does not replace that evaluation, and the importance of remaining in the ED until their evaluation is complete.     Darrick Grinder, PA-C 04/04/22 1500

## 2022-04-04 NOTE — ED Provider Notes (Signed)
[facility] EMERGENCY DEPARTMENT Provider Note   CSN: [id number] Arrival date & time: 04/04/22  1302     History  Chief Complaint  Patient presents with   Cough    Aryella Besecker is a 41 y.o. female here with cough and fever.  Patient states that she has not been feeling well for the last week or so.  She has nonproductive cough and chills since yesterday.  The history is provided by the patient.       Home Medications Prior to Admission medications   Medication Sig Start Date End Date Taking? Authorizing Provider  albuterol (VENTOLIN HFA) 108 (90 Base) MCG/ACT inhaler Inhale 2 puffs into the lungs every 4 (four) hours as needed for wheezing or shortness of breath. 05/11/19   Bethann Berkshire, MD  benzocaine (HURRICAINE) 20 % GEL Use as directed 1 application in the mouth or throat 3 (three) times daily as needed. Patient not taking: Reported on 05/11/2019 04/09/19   Mannie Stabile, PA-C  cyclobenzaprine (FLEXERIL) 10 MG tablet Take 1 tablet (10 mg total) by mouth 2 (two) times daily as needed for muscle spasms. Patient not taking: Reported on 05/11/2019 07/21/17   Liberty Handy, PA-C  diphenhydrAMINE (BENADRYL) 25 MG tablet Take 1 tablet (25 mg total) by mouth every 6 (six) hours. Patient not taking: Reported on 05/11/2019 05/14/15   Anselm Pancoast, PA-C  ferrous gluconate (FERGON) 324 MG tablet Take 1 tablet (324 mg total) by mouth 2 (two) times daily with a meal. Patient not taking: Reported on 05/11/2019 12/02/17 01/01/18  Nira Conn, MD  ibuprofen (ADVIL) 800 MG tablet Take 1 tablet (800 mg total) by mouth every 8 (eight) hours as needed for moderate pain. 05/11/19   Bethann Berkshire, MD      Allergies    Patient has no known allergies.    Review of Systems   Review of Systems  Respiratory:  Positive for cough.   All other systems reviewed and are negative.   Physical Exam Updated Vital Signs BP (!) 146/78 (BP Location: Right Arm)   Pulse  85   Temp 98.3 F (36.8 C) (Oral)   Resp 18   SpO2 100%  Physical Exam Vitals and nursing note reviewed.  Constitutional:      Comments: Slightly uncomfortable  HENT:     Head: Normocephalic.     Nose: Nose normal.     Mouth/Throat:     Mouth: Mucous membranes are dry.  Eyes:     Extraocular Movements: Extraocular movements intact.     Pupils: Pupils are equal, round, and reactive to light.  Cardiovascular:     Rate and Rhythm: Normal rate and regular rhythm.     Pulses: Normal pulses.     Heart sounds: Normal heart sounds.  Pulmonary:     Effort: Pulmonary effort is normal.     Comments: Slightly tachypneic and diminished bilaterally.  No wheezing or crackle Abdominal:     General: Abdomen is flat.     Palpations: Abdomen is soft.  Musculoskeletal:        General: Normal range of motion.     Cervical back: Normal range of motion and neck supple.  Skin:    General: Skin is warm.     Capillary Refill: Capillary refill takes less than 2 seconds.  Neurological:     General: No focal deficit present.     Mental Status: She is alert and oriented to person, place, and time.  Psychiatric:        Mood and Affect: Mood normal.        Behavior: Behavior normal.     ED Results / Procedures / Treatments   Labs (all labs ordered are listed, but only abnormal results are displayed) Labs Reviewed  RESP PANEL BY RT-PCR (RSV, FLU A&B, COVID)  RVPGX2 - Abnormal; Notable for the following components:      Result Value   Influenza B by PCR POSITIVE (*)    All other components within normal limits    EKG None  Radiology DG Chest 2 View  Result Date: 04/04/2022 CLINICAL DATA:  Cough EXAM: CHEST - 2 VIEW COMPARISON:  Chest x-ray dated May 11, 2019 FINDINGS: The heart size and mediastinal contours are within normal limits. Both lungs are clear. The visualized skeletal structures are unremarkable. IMPRESSION: No active cardiopulmonary disease. Electronically Signed   By: Allegra Lai M.D.   On: 04/04/2022 15:23    Procedures Procedures    Medications Ordered in ED Medications - No data to display  ED Course/ Medical Decision Making/ A&P                           Medical Decision Making Lawana Hartzell is a 41 y.o. female here presenting with cough and wheezing and fever. CXR clear. Patient is flu B positive. Not hypoxic. Will dc home with tamiflu     Problems Addressed: Flu syndrome: acute illness or injury  Amount and/or Complexity of Data Reviewed Labs: ordered. Decision-making details documented in ED Course. Radiology: ordered and independent interpretation performed. Decision-making details documented in ED Course.    Final Clinical Impression(s) / ED Diagnoses Final diagnoses:  None    Rx / DC Orders ED Discharge Orders     None         Charlynne Pander, MD 04/04/22 1858

## 2022-04-04 NOTE — ED Triage Notes (Signed)
Patient complains of a cough that started the middle last week and generalized body aches that started last night. Afebrile in triage, patient is alert, oriented, ambulating independently with steady gait and speaking in complete sentences.

## 2023-11-05 ENCOUNTER — Other Ambulatory Visit (HOSPITAL_COMMUNITY): Payer: Self-pay

## 2023-11-05 ENCOUNTER — Ambulatory Visit (HOSPITAL_COMMUNITY)
Admission: EM | Admit: 2023-11-05 | Discharge: 2023-11-05 | Disposition: A | Discharge status: Home / Self Care | Payer: Self-pay | Attending: Family Medicine | Admitting: Family Medicine

## 2023-11-05 ENCOUNTER — Encounter (HOSPITAL_COMMUNITY): Payer: Self-pay

## 2023-11-05 DIAGNOSIS — M545 Low back pain, unspecified: Secondary | ICD-10-CM

## 2023-11-05 DIAGNOSIS — M62830 Muscle spasm of back: Secondary | ICD-10-CM

## 2023-11-05 MED ORDER — CYCLOBENZAPRINE HCL 10 MG PO TABS: ORAL_TABLET | ORAL | 0 refills | Status: AC

## 2023-11-05 MED ORDER — DICLOFENAC SODIUM 75 MG PO TBEC: 75.0000 mg | DELAYED_RELEASE_TABLET | Freq: Two times a day (BID) | ORAL | 0 refills | Status: AC

## 2023-11-05 NOTE — ED Triage Notes (Signed)
 Lower back pain started 2-3 days ago after lifting heavy equipment at work.

## 2023-11-05 NOTE — Discharge Instructions (Signed)
HOME CARE INSTRUCTIONS: For many people, back pain returns. Since low back pain is rarely dangerous, it is often a condition that people can learn to manage on their own. Please remain active. It is stressful on the back to sit or stand in one place. Do not sit, drive, or stand in one place for more than 30 minutes at a time. Take short walks on level surfaces as soon as pain allows. Try to increase the length of time you walk each day. Do not stay in bed. Resting more than 1 or 2 days can delay your recovery. Do not avoid exercise or work. Your body is made to move. It is not dangerous to be active, even though your back may hurt. Your back will likely heal faster if you return to being active before your pain is gone. Over-the-counter medicines to reduce pain and inflammation are often the most helpful. ° °SEEK MEDICAL CARE IF: °You have pain that is not relieved with rest or medicine. °You have pain that does not improve in 1 week. °You have new symptoms. °You are generally not feeling well. ° °SEEK IMMEDIATE MEDICAL CARE IF: °You have pain that radiates from your back into your legs. °You develop new bowel or bladder control problems. °You have unusual weakness or numbness in your arms or legs. °You develop nausea or vomiting. °You develop abdominal pain. °You feel faint. °

## 2023-11-05 NOTE — ED Provider Notes (Signed)
 [facility] CARE CENTER   [id number] 11/05/23 Arrival Time: 1047  ASSESSMENT & PLAN:  1. Acute bilateral low back pain without sciatica   2. Muscle spasm of back     Able to ambulate here and hemodynamically stable. No indication for imaging of back at this time given no trauma and normal neurological exam. Discussed.  Meds ordered this encounter  Medications   diclofenac  (VOLTAREN ) 75 MG EC tablet    Sig: Take 1 tablet (75 mg total) by mouth 2 (two) times daily.    Dispense:  14 tablet    Refill:  0   cyclobenzaprine  (FLEXERIL ) 10 MG tablet    Sig: Take 1 tablet by mouth 3 times daily as needed for muscle spasm. Warning: May cause drowsiness.    Dispense:  21 tablet    Refill:  0   Work/school excuse note: provided. Medication sedation precautions given. Encourage ROM/movement as tolerated.  Recommend:  Follow-up Information     [facility].   Specialty: Urgent Care Why: If worsening or failing to improve as anticipated. Contact information: [address] [place]  [address] [phone]                Reviewed expectations re: course of current medical issues. Questions answered. Outlined signs and symptoms indicating need for more acute intervention. Patient verbalized understanding. After Visit Summary given.   SUBJECTIVE: History from: patient.  Megan Parks is a 43 y.o. female who presents with complaint of persistent bilateral lower back pain. First noted 3-4 d ago after lifting heavy box; works as Public librarian. Back trauma: denied. History of back problems requiring medical care: rare. Pain described as aching and without radiation. Aggravating factors: certain movements and prolonged sitting. Alleviating factors: have not been identified. Progressive LE weakness or saddle anesthesia: denied. Extremity sensation changes or weakness: denied. Ambulatory without difficulty. Normal bowel/bladder habits: yes;  without urinary retention. Normal PO intake without n/v. No associated abdominal pain/n/v. Self treatment: has prn ibuprofen  without much help.   OBJECTIVE:  Vitals:   11/05/23 1120  BP: (!) 170/89  Pulse: 89  Resp: 18  Temp: 98.4 F (36.9 C)  TempSrc: Oral    General appearance: alert; no distress HEENT: [place]; AT Neck: supple with FROM; without midline tenderness CV: regular Lungs: unlabored respirations; speaks full sentences without difficulty Abdomen: soft, non-tender; non-distended Back: moderate and poorly localized tenderness to palpation over bilateral lumbar paraspinal musculature (L>R); FROM at waist; bruising: none; without midline tenderness Extremities: with edema; symmetrical without gross deformities; normal ROM of bilateral LE Skin: warm and dry Neurologic: normal gait; normal sensation and strength of bilateral LE Psychological: alert and cooperative; normal mood and affect  Labs: Labs Reviewed - No data to display  Imaging: No results found.  No Known Allergies  Past Medical History:  Diagnosis Date   Anemia 06/29/2013   Dysmenorrhea    Social History   Socioeconomic History   Marital status: Single    Spouse name: Not on file   Number of children: Not on file   Years of education: Not on file   Highest education level: Not on file  Occupational History   Not on file  Tobacco Use   Smoking status: Former    Current packs/day: 0.00    Average packs/day: 0.5 packs/day for 17.0 years (8.5 ttl pk-yrs)    Types: Cigarettes    Start date: 11/27/2000    Quit date: 11/27/2017    Years since quitting:  5.9   Smokeless tobacco: Never   Tobacco comments:    One pack every two days  Substance and Sexual Activity   Alcohol use: No   Drug use: No   Sexual activity: Yes    Partners: Male    Birth control/protection: None  Other Topics Concern   Not on file  Social History Narrative   Not on file   Social Drivers of Health   Financial Resource  Strain: Not on file  Food Insecurity: Not on file  Transportation Needs: Not on file  Physical Activity: Not on file  Stress: Not on file  Social Connections: Not on file  Intimate Partner Violence: Not on file   Family History  Problem Relation Age of Onset   Diabetes Mother    Breast cancer Paternal Aunt    Diabetes Maternal Grandmother    Hypertension Maternal Grandmother    Past Surgical History:  Procedure Laterality Date   CESAREAN SECTION  2001   CESAREAN SECTION WITH BILATERAL TUBAL LIGATION  2002      Rolinda Rogue, MD 11/05/23 1152
